# Patient Record
Sex: Female | Born: 1950 | Race: White | Hispanic: No | Smoking: Current every day smoker
Health system: Southern US, Community
[De-identification: ages and names within clinical notes are randomized; demographics above are authoritative.]

## PROBLEM LIST (undated history)

## (undated) DIAGNOSIS — F32A Depression, unspecified: Secondary | ICD-10-CM

## (undated) DIAGNOSIS — E785 Hyperlipidemia, unspecified: Secondary | ICD-10-CM

## (undated) DIAGNOSIS — I639 Cerebral infarction, unspecified: Secondary | ICD-10-CM

## (undated) DIAGNOSIS — I10 Essential (primary) hypertension: Secondary | ICD-10-CM

## (undated) DIAGNOSIS — F419 Anxiety disorder, unspecified: Secondary | ICD-10-CM

## (undated) DIAGNOSIS — F329 Major depressive disorder, single episode, unspecified: Secondary | ICD-10-CM

## (undated) DIAGNOSIS — Z720 Tobacco use: Secondary | ICD-10-CM

## (undated) DIAGNOSIS — J45909 Unspecified asthma, uncomplicated: Secondary | ICD-10-CM

## (undated) DIAGNOSIS — H913 Deaf nonspeaking, not elsewhere classified: Secondary | ICD-10-CM

## (undated) HISTORY — PX: CHOLECYSTECTOMY: SHX55

---

## 2003-05-07 ENCOUNTER — Encounter: Payer: Self-pay | Admitting: Emergency Medicine

## 2003-05-07 ENCOUNTER — Emergency Department (HOSPITAL_COMMUNITY): Admission: EM | Admit: 2003-05-07 | Discharge: 2003-05-07 | Payer: Self-pay | Admitting: Emergency Medicine

## 2009-06-14 ENCOUNTER — Inpatient Hospital Stay (HOSPITAL_COMMUNITY): Admission: EM | Admit: 2009-06-14 | Discharge: 2009-06-18 | Payer: Self-pay | Admitting: Emergency Medicine

## 2009-06-15 ENCOUNTER — Encounter (INDEPENDENT_AMBULATORY_CARE_PROVIDER_SITE_OTHER): Payer: Self-pay | Admitting: Internal Medicine

## 2009-06-16 ENCOUNTER — Encounter (INDEPENDENT_AMBULATORY_CARE_PROVIDER_SITE_OTHER): Payer: Self-pay | Admitting: Internal Medicine

## 2011-03-06 LAB — BASIC METABOLIC PANEL
BUN: 12 mg/dL (ref 6–23)
BUN: 18 mg/dL (ref 6–23)
CO2: 26 mEq/L (ref 19–32)
CO2: 26 mEq/L (ref 19–32)
Calcium: 8.9 mg/dL (ref 8.4–10.5)
Calcium: 9.4 mg/dL (ref 8.4–10.5)
Calcium: 9.9 mg/dL (ref 8.4–10.5)
Chloride: 105 mEq/L (ref 96–112)
Chloride: 106 mEq/L (ref 96–112)
Creatinine, Ser: 0.54 mg/dL (ref 0.4–1.2)
Creatinine, Ser: 0.63 mg/dL (ref 0.4–1.2)
Creatinine, Ser: 0.69 mg/dL (ref 0.4–1.2)
GFR calc Af Amer: >60 mL/min (ref >60)
GFR calc Af Amer: >60 mL/min (ref >60)
GFR calc Af Amer: >60 mL/min (ref >60)
GFR calc non Af Amer: >60 mL/min (ref >60)
GFR calc non Af Amer: >60 mL/min (ref >60)
GFR calc non Af Amer: >60 mL/min (ref >60)
Glucose, Bld: 94 mg/dL (ref 70–99)
Glucose, Bld: 98 mg/dL (ref 70–99)
Glucose, Bld: 99 mg/dL (ref 70–99)
Potassium: 3.3 mEq/L — ABNORMAL LOW (ref 3.5–5.1)
Potassium: 4.1 mEq/L (ref 3.5–5.1)
Sodium: 138 mEq/L (ref 135–145)
Sodium: 141 mEq/L (ref 135–145)

## 2011-03-06 LAB — MAGNESIUM: Magnesium: 1.7 mg/dL (ref 1.5–2.5)

## 2011-03-06 LAB — COMPREHENSIVE METABOLIC PANEL
BUN: 3 mg/dL — ABNORMAL LOW (ref 6–23)
CO2: 25 mEq/L (ref 19–32)
Calcium: 9.2 mg/dL (ref 8.4–10.5)
Chloride: 107 mEq/L (ref 96–112)
Creatinine, Ser: 0.55 mg/dL (ref 0.4–1.2)
GFR calc non Af Amer: >60 mL/min (ref >60)
Glucose, Bld: 95 mg/dL (ref 70–99)
Total Bilirubin: 0.7 mg/dL (ref 0.3–1.2)

## 2011-03-06 LAB — LUPUS ANTICOAGULANT PANEL: Lupus Anticoagulant: NOT DETECTED

## 2011-03-06 LAB — CBC
HCT: 46 % (ref 36.0–46.0)
HCT: 46.7 % — ABNORMAL HIGH (ref 36.0–46.0)
Hemoglobin: 17.3 g/dL — ABNORMAL HIGH (ref 12.0–15.0)
MCHC: 34.4 g/dL (ref 30.0–36.0)
MCV: 97.9 fL (ref 78.0–100.0)
Platelets: 181 10*3/uL (ref 150–400)
Platelets: 186 10*3/uL (ref 150–400)
Platelets: 190 10*3/uL (ref 150–400)
RDW: 13.2 % (ref 11.5–15.5)
RDW: 13.3 % (ref 11.5–15.5)
RDW: 13.4 % (ref 11.5–15.5)

## 2011-03-06 LAB — POCT CARDIAC MARKERS
CKMB, poc: 1 ng/mL (ref 1.0–8.0)
Myoglobin, poc: 50.8 ng/mL (ref 12–200)
Troponin i, poc: <0.05 ng/mL (ref 0.00–0.09)

## 2011-03-06 LAB — CREATININE, URINE, RANDOM: Creatinine, Urine: 80.5 mg/dL

## 2011-03-06 LAB — LIPID PANEL
LDL Cholesterol: 222 mg/dL — ABNORMAL HIGH (ref 0–99)
Triglycerides: 234 mg/dL — ABNORMAL HIGH (ref <150)

## 2011-03-06 LAB — BETA-2-GLYCOPROTEIN I ABS, IGG/M/A
Beta-2 Glyco I IgG: <10 U/mL (ref <20)
Beta-2-Glycoprotein I IgA: <10 U/mL (ref <10)

## 2011-03-06 LAB — DIFFERENTIAL
Basophils Absolute: 0.2 10*3/uL — ABNORMAL HIGH (ref 0.0–0.1)
Basophils Relative: 2 % — ABNORMAL HIGH (ref 0–1)
Lymphocytes Relative: 28 % (ref 12–46)
Monocytes Absolute: 0.5 10*3/uL (ref 0.1–1.0)
Neutro Abs: 5.2 10*3/uL (ref 1.7–7.7)
Neutrophils Relative %: 63 % (ref 43–77)

## 2011-03-06 LAB — PROTIME-INR
INR: 1.1 (ref 0.00–1.49)
Prothrombin Time: 14.4 seconds (ref 11.6–15.2)

## 2011-03-06 LAB — BRAIN NATRIURETIC PEPTIDE: Pro B Natriuretic peptide (BNP): 199 pg/mL — ABNORMAL HIGH (ref 0.0–100.0)

## 2011-03-06 LAB — URINALYSIS, ROUTINE W REFLEX MICROSCOPIC
Nitrite: NEGATIVE
Protein, ur: 100 mg/dL — AB
Specific Gravity, Urine: 1.005 (ref 1.005–1.030)
Urobilinogen, UA: 0.2 mg/dL (ref 0.0–1.0)

## 2011-03-06 LAB — FACTOR 5 LEIDEN

## 2011-03-06 LAB — PROTHROMBIN GENE MUTATION

## 2011-03-06 LAB — METANEPHRINES, URINE, 24 HOUR: Volume, Urine-METAN: 1250 mL

## 2011-03-06 LAB — CATECHOLAMINES, FRACTIONATED, URINE, 24 HOUR
Norepinephrine 24 Hr Urine: 50 ug/24hr (ref <80)
Total urine volume: 1250 mL

## 2011-03-06 LAB — CARDIOLIPIN ANTIBODIES, IGG, IGM, IGA
Anticardiolipin IgA: <10 [APL'U] — ABNORMAL LOW (ref <13)
Anticardiolipin IgG: <10 [GPL'U] — ABNORMAL LOW (ref <11)

## 2011-03-06 LAB — ANTITHROMBIN III: AntiThromb III Func: 96 % (ref 76–126)

## 2011-03-06 LAB — URINE MICROSCOPIC-ADD ON

## 2011-03-06 LAB — VMA + CREATININE, URINE (TIMED COLLECTION)
VMA, 24H Ur Adult: 3.1 mg/24hr (ref 1.8–6.7)
Volume, Urine-VMAUR: 1250

## 2011-03-06 LAB — PROTEIN C ACTIVITY: Protein C Activity: 155 % — ABNORMAL HIGH (ref 75–133)

## 2011-03-06 LAB — SEDIMENTATION RATE: Sed Rate: 6 mm/hr (ref 0–22)

## 2011-03-06 LAB — TSH: TSH: 2.815 u[IU]/mL (ref 0.350–4.500)

## 2011-04-12 NOTE — Consult Note (Signed)
NAMECHRISHA, Sabrina Meyer NO.:  [id number]   MEDICAL RECORD NO.:  [id number]          PATIENT TYPE:  INP   LOCATION:  0103                         FACILITY:  [facility]   PHYSICIAN:  Noel Christmas, MD    DATE OF BIRTH:  [birth year]   DATE OF CONSULTATION:  DATE OF DISCHARGE:                                 CONSULTATION   REASON FOR CONSULTATION:  Acute right facial and upper extremity  weakness and numbness.   HISTORY OF PRESENT ILLNESS:  This is a 60 year old lady who experienced  acute onset of weakness and numbness involving the right side of her  face and right upper extremity yesterday.  She noticed difficulty with  writing as well as clumsiness of her right hand.  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  Symptoms persisted today that started to  improve slightly this evening.  She has been on aspirin daily.  She has  a history of hypertension and elevated cholesterol, but has been off of  antihypertensive medications as well as cholesterol medication for about  7-8 years.  Blood pressure on presentation was 236/156.  CT scan of her  head showed no acute intracranial findings.  She had bilateral deep  white matter lacunar type infarctions.  These appeared to be chronic.  She had no signs of active hemorrhage.   PAST MEDICAL HISTORY:  Remarkable for hypertension and hyperlipidemia.   MEDICATIONS:  Aspirin daily.  The patient has no other medications.   FAMILY HISTORY:  Negative for cerebrovascular disease.   PHYSICAL EXAMINATION:  GENERAL:  The patient communicated using  interpreter.  She was oriented x3.  She complained of headache.  Mental  status was normal.  Pupils were equal and reacted normally to light.  Extraocular movements were full and conjugate.  Visual fields were  intact and normal.  She had mild right lower facial numbness as well as  mild right lower facial weakness.  She had no right upper extremity  pronator drift.  Strength of upper  extremities was normal proximally and  distally.  Muscle tone was normal as well.  Deep tendon reflexes were  normal and symmetrical throughout.  Plantar responses were flexor.  She  had normal sensation in upper and lower extremities to tactile  stimulation as well as vibratory sensation.  Coordination of upper  extremities was normal.  Carotid auscultation was normal.   CLINICAL IMPRESSION:  1. Probable acute subcortical small vessel ischemic infarction      following deep white matter on the left side with residual mild      facial weakness and numbness.  2. Uncontrolled hypertension.   RECOMMENDATIONS:  1. MRI and MRA in the morning without contrast.  2. Carotid Doppler study and echocardiogram in the a.m. as well.  3. Will discontinue aspirin and substitute Plavix 75 mg per day.  4. Speech and language pathology consults for dysphagia.      Noel Christmas, MD  Electronically Signed     CS/MEDQ  D:  06/14/2009  T:  06/15/2009  Job:  [id number]

## 2011-04-12 NOTE — Discharge Summary (Signed)
Sabrina Meyer, Sabrina Meyer NO.:  [id number]   MEDICAL RECORD NO.:  [id number]          PATIENT TYPE:  INP   LOCATION:  1306                         FACILITY:  [facility]   PHYSICIAN:  Beckey Rutter, MD  DATE OF BIRTH:  1951/05/29   DATE OF ADMISSION:  06/14/2009  DATE OF DISCHARGE:                               DISCHARGE SUMMARY   PRIMARY CARE PHYSICIAN:  Unassigned to [facility].   CHIEF COMPLAINT:  Felt funny on the left side.   HISTORY OF PRESENT ILLNESS:  A 60 year old with history of hypertension,  noncompliance with her medication, who presented with difficulty  drinking secondary to facial droops on the right side.   HOSPITAL PROCEDURES:  Chest x-ray on June 14, 2009. Impression is  central airway thickening suggesting bronchitis.  No edema or  significant air space disease.   CT head without contrast on June 14, 2009.  Impression is  1. Several small lacunar infarcts in the thalami and right      periventricular white matter which are probably chronic.  An acute      small vessel stroke cannot be excluded.  2. No evidence of acute stroke or intracranial hemorrhage.  MRI to the      brain showing acute infarction in the posterior limb of internal      capsule to the left.  MRA showing no significant intracranial      stenosis.   MRA abdomen showing no evidence of renal artery stenosis.  Two separate  right renal arteries and single left renal artery show normal patency.   Sodium 149, potassium 4.1, chloride 102, bicarb 26, glucose 98, BUN 18,  creatinine 0.69. White blood count is 8.8, hemoglobin 15.9 and platelet  count of 181.   HOSPITAL COURSE BY PROBLEMS:  1. New stroke.  The patient was seen in consultation by neurology      service.  The recommendation is to change aspirin to Plavix.  It is      more important for the patient to comply with the medication. Hence      the patient is not really regulating her blood pressure      efficiently.  2.  Blood pressure. Medication as prescribed with better control.  3. Hyperlipidemia with LDL tested is 222, HDL 32, total cholesterol      301.  The patient will be discharged on statins.  4. The patient has congenital deafness and the communication has been      through a sign interpreter, her grandson.   DISCHARGE MEDICATIONS:  1. Plavix 75 mg p.o. daily.  2. Hydrochlorothiazide 25 mg p.o. daily.  3. Lisinopril 20 mg p.o. daily.  4. Lopressor 25 mg p.o. b.i.d.  5. Zocor 20 mg p.o. at bedtime.   DISCHARGE DIAGNOSES:  1. Acute infarction in the posterior limb of internal capsule to the      left.  2. 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  3. Hypercholesterolemia.  4. Hypertension.  5. Medical noncompliance.   Two-D echo result was showing left ventricular cavity was normal,  systolic function was  estimated to 50-55% with normal wall motions.   DISCHARGE PLAN:  The patient was discharged to continue on and Plavix  and the blood pressure medication as well as the statins.  The patient  was advised to follow up with the physician.  During hospital stay the  patient was counseled in regarding to tobacco abuse.      Beckey Rutter, MD  Electronically Signed     EME/MEDQ  D:  06/18/2009  T:  06/18/2009  Job:  [id number]

## 2011-04-12 NOTE — H&P (Signed)
NAMEMarland Kitchen  Sabrina Meyer, Sabrina Meyer NO.:  [id number]   MEDICAL RECORD NO.:  [id number]          PATIENT TYPE:  EMS   LOCATION:  ED                           FACILITY:  [facility]   PHYSICIAN:  Massie Maroon, MD        DATE OF BIRTH:  12-07-1950   DATE OF ADMISSION:  06/14/2009  DATE OF DISCHARGE:                              HISTORY & PHYSICAL   PRIMARY CARE PHYSICIAN:  The patient has no primary care physician.   CHIEF COMPLAINT:  Felt funny on the right side.   HISTORY OF PRESENT ILLNESS:  A 60 year old female with hypertension, not  taking her medicine, and apparently complains of decrease grip strength  and difficulty drinking because of some facial droop on the right side  as well as some facial numbness.  She noted that her symptoms started  yesterday at noon. She also complained of some slight headache.  The  patient denies any vision change, chest pain, palpitations, shortness of  breath, nausea, vomiting, abdominal pain, hematuria.  The patient states  that her legs are fine.  The patient was seen in the ED and was found to  have a systolic blood pressure of greater than 250. The patient  had CT  scan which showed several small lacunar infarcts in the thalamus and  right periventricular white matter disease, probably chronic.  The  patient will be admitted for workup of hypertensive emergency, possible  CVA.   PAST MEDICAL HISTORY:  1. Hypertension.  2. Hyperlipidemia.   PAST SURGICAL HISTORY:  Cholecystectomy.   OB/GYN HISTORY:  Normal spontaneous vaginal delivery x2, no  miscarriages.   SOCIAL HISTORY:  The patient smokes one pack per day times 40 years.  She does not drink.   FAMILY HISTORY:  Mother died at age 16 of diabetes.  Her father died at  age 65 of unknown causes.  Siblings:  One sister is alive and had heart  surgery last year and has dementia now.  One brother is healthy.   There is no family history of stroke.   ALLERGIES:  NO KNOWN DRUG  ALLERGIES.   MEDICATIONS:  None.   REVIEW OF SYSTEMS:  Negative for 10 organ systems except for pertinent  positives stated above.   PHYSICAL EXAMINATION:  VITAL SIGNS:  Temperature afebrile, pulse 93,  blood pressure 208/119, pulse ox 95% on room air.  HEENT: Anicteric, EO MI, no nystagmus, pupils 1.5 mm, symmetric, direct,  consensual, near reflexes intact.  No vision loss, mucous membranes  moist.  Tongue midline, possibly slightly deviated to the right, right  facial droop.  NECK:  No JVD, no bruit, no thyromegaly, no adenopathy.  HEART:  Regular rate and rhythm, S1 and S2. No murmurs, gallops or rubs.  LUNGS:  Clear to auscultation bilaterally.  ABDOMEN:  Soft, nontender, nondistended.  Positive bowel sounds.  EXTREMITIES:  No cyanosis, clubbing or edema, DP pulses 2+ bilaterally,  onychomycosis, skin tag behind the right knee.  LYMPH:  Nodes no adenopathy.  NEUROLOGIC:  Right facial droop, sensation on the face intact, the  patient did not  complain of any difference right or left side of the  face to pinprick, however she did complain that pinprick was slightly  decreased on the right arm.  Reflexes were 2+, symmetric, diffuse with  downgoing toe on the right and equivocal on the left, motor strength 5/5  in all four extremities. No pronator drift.   LABORATORY DATA:  WBC 8.3, hemoglobin 17.3 (high), platelet count  190,000. Sodium 142, potassium 3.3 (low), chloride 106, bicarb 26, BUN  3, creatinine 0.54, glucose 94, BNP 199, troponin-I less than 0.05. UA  showed WBC's 3 to 6, RBC's 0 to 2.   Chest x-ray showed bronchitis. CT brain showed above HPI reading.   EKG sinus tach at 105, left atrial enlargement, LVH, ST depression in F,  AVF, AVL, T-wave inversion in III, AVF.   ASSESSMENT/PLAN:  1. Right handed numbness and weakness, possibly CVA versus      hypertensive emergency.  The patient will be given Labetalol 10 mg      IV times 1 and Labetalol 100 mg p.o. b.i.d.  and hydralazine 10 mg      IV q.6 h p.r.n. systolic blood pressure greater than 180. Will also      use hydrochlorothiazide 12.5 mg p.o. daily to control blood      pressure.  The patient will be started on aspirin, Lipitor 40 mg      p.o. q.h.s..  Will obtain an MRI brain with and without contrast,      carotid ultrasound and cardiac 2-D echo and neurology consult.  We      appreciate neurology's input. We will obtain speech therapy consult      to rule out aspiration. Will check a Sed rate, ANA, TSH, B-12, RPR,      homocystine, PT, PTT, hypercoagulable panel.  2. Hypertension.  We will use labetalol, hydralazine, and      hydrochlorothiazide to control blood pressure for now. Will check      an MRA, urine catecholamines, VMA, metanephrine's, aldosterone, and      __________levels.  3. Tobacco dependence.  Nicotine patch topically 21 mg topically q.      Day.  4. DVT prophylaxis. Lovenox 40 mg SQ daily.      Massie Maroon, MD  Electronically Signed     JYK/MEDQ  D:  06/14/2009  T:  06/14/2009  Job:  [id number]

## 2012-12-02 ENCOUNTER — Emergency Department (HOSPITAL_COMMUNITY): Payer: Medicare Other

## 2012-12-02 ENCOUNTER — Inpatient Hospital Stay (HOSPITAL_COMMUNITY)
Admission: EM | Admit: 2012-12-02 | Discharge: 2012-12-04 | DRG: 304 | Disposition: A | Discharge status: Home Health | Payer: Medicare Other | Attending: Internal Medicine | Admitting: Internal Medicine

## 2012-12-02 ENCOUNTER — Encounter (HOSPITAL_COMMUNITY): Payer: Self-pay | Admitting: *Deleted

## 2012-12-02 DIAGNOSIS — H913 Deaf nonspeaking, not elsewhere classified: Secondary | ICD-10-CM

## 2012-12-02 DIAGNOSIS — Z23 Encounter for immunization: Secondary | ICD-10-CM

## 2012-12-02 DIAGNOSIS — Z7902 Long term (current) use of antithrombotics/antiplatelets: Secondary | ICD-10-CM

## 2012-12-02 DIAGNOSIS — E785 Hyperlipidemia, unspecified: Secondary | ICD-10-CM

## 2012-12-02 DIAGNOSIS — D72829 Elevated white blood cell count, unspecified: Secondary | ICD-10-CM | POA: Diagnosis present

## 2012-12-02 DIAGNOSIS — J9600 Acute respiratory failure, unspecified whether with hypoxia or hypercapnia: Secondary | ICD-10-CM

## 2012-12-02 DIAGNOSIS — F17200 Nicotine dependence, unspecified, uncomplicated: Secondary | ICD-10-CM | POA: Diagnosis present

## 2012-12-02 DIAGNOSIS — F32A Depression, unspecified: Secondary | ICD-10-CM

## 2012-12-02 DIAGNOSIS — Z8673 Personal history of transient ischemic attack (TIA), and cerebral infarction without residual deficits: Secondary | ICD-10-CM

## 2012-12-02 DIAGNOSIS — F419 Anxiety disorder, unspecified: Secondary | ICD-10-CM

## 2012-12-02 DIAGNOSIS — I5043 Acute on chronic combined systolic (congestive) and diastolic (congestive) heart failure: Secondary | ICD-10-CM | POA: Diagnosis present

## 2012-12-02 DIAGNOSIS — Z91199 Patient's noncompliance with other medical treatment and regimen due to unspecified reason: Secondary | ICD-10-CM

## 2012-12-02 DIAGNOSIS — N179 Acute kidney failure, unspecified: Secondary | ICD-10-CM | POA: Diagnosis present

## 2012-12-02 DIAGNOSIS — F411 Generalized anxiety disorder: Secondary | ICD-10-CM | POA: Diagnosis present

## 2012-12-02 DIAGNOSIS — F329 Major depressive disorder, single episode, unspecified: Secondary | ICD-10-CM

## 2012-12-02 DIAGNOSIS — I169 Hypertensive crisis, unspecified: Secondary | ICD-10-CM

## 2012-12-02 DIAGNOSIS — I10 Essential (primary) hypertension: Secondary | ICD-10-CM

## 2012-12-02 DIAGNOSIS — D45 Polycythemia vera: Secondary | ICD-10-CM | POA: Diagnosis present

## 2012-12-02 DIAGNOSIS — J45909 Unspecified asthma, uncomplicated: Secondary | ICD-10-CM | POA: Diagnosis present

## 2012-12-02 DIAGNOSIS — I509 Heart failure, unspecified: Secondary | ICD-10-CM

## 2012-12-02 DIAGNOSIS — E876 Hypokalemia: Secondary | ICD-10-CM

## 2012-12-02 DIAGNOSIS — J811 Chronic pulmonary edema: Secondary | ICD-10-CM

## 2012-12-02 DIAGNOSIS — Z9119 Patient's noncompliance with other medical treatment and regimen: Secondary | ICD-10-CM

## 2012-12-02 DIAGNOSIS — I161 Hypertensive emergency: Secondary | ICD-10-CM

## 2012-12-02 DIAGNOSIS — Z720 Tobacco use: Secondary | ICD-10-CM

## 2012-12-02 DIAGNOSIS — I639 Cerebral infarction, unspecified: Secondary | ICD-10-CM

## 2012-12-02 DIAGNOSIS — Z79899 Other long term (current) drug therapy: Secondary | ICD-10-CM

## 2012-12-02 HISTORY — DX: Major depressive disorder, single episode, unspecified: F32.9

## 2012-12-02 HISTORY — DX: Essential (primary) hypertension: I10

## 2012-12-02 HISTORY — DX: Deaf nonspeaking, not elsewhere classified: H91.3

## 2012-12-02 HISTORY — DX: Cerebral infarction, unspecified: I63.9

## 2012-12-02 HISTORY — DX: Hyperlipidemia, unspecified: E78.5

## 2012-12-02 HISTORY — DX: Tobacco use: Z72.0

## 2012-12-02 HISTORY — DX: Unspecified asthma, uncomplicated: J45.909

## 2012-12-02 HISTORY — DX: Anxiety disorder, unspecified: F41.9

## 2012-12-02 HISTORY — DX: Depression, unspecified: F32.A

## 2012-12-02 LAB — POCT I-STAT 3, ART BLOOD GAS (G3+)
O2 Saturation: 96 %
TCO2: 19 mmol/L (ref 0–100)
pCO2 arterial: 38.6 mmHg (ref 35.0–45.0)
pH, Arterial: 7.263 — ABNORMAL LOW (ref 7.350–7.450)
pO2, Arterial: 98 mmHg (ref 80.0–100.0)

## 2012-12-02 LAB — COMPREHENSIVE METABOLIC PANEL
ALT: 12 U/L (ref 0–35)
Alkaline Phosphatase: 78 U/L (ref 39–117)
BUN: 11 mg/dL (ref 6–23)
CO2: 23 mEq/L (ref 19–32)
Chloride: 99 mEq/L (ref 96–112)
GFR calc Af Amer: >90 mL/min (ref >90)
GFR calc non Af Amer: >90 mL/min (ref >90)
Glucose, Bld: 181 mg/dL — ABNORMAL HIGH (ref 70–99)
Potassium: 2.9 mEq/L — ABNORMAL LOW (ref 3.5–5.1)
Sodium: 137 mEq/L (ref 135–145)
Total Bilirubin: 0.4 mg/dL (ref 0.3–1.2)
Total Protein: 7.5 g/dL (ref 6.0–8.3)

## 2012-12-02 LAB — CBC WITH DIFFERENTIAL/PLATELET
Basophils Absolute: 0 10*3/uL (ref 0.0–0.1)
Eosinophils Absolute: 0.4 10*3/uL (ref 0.0–0.7)
HCT: 58.8 % — ABNORMAL HIGH (ref 36.0–46.0)
HCT: 51.6 % — ABNORMAL HIGH (ref 36.0–46.0)
Hemoglobin: 17.6 g/dL — ABNORMAL HIGH (ref 12.0–15.0)
Lymphocytes Relative: 38 % (ref 12–46)
Lymphocytes Relative: 11 % — ABNORMAL LOW (ref 12–46)
Lymphs Abs: 1.8 10*3/uL (ref 0.7–4.0)
MCHC: 35 g/dL (ref 30.0–36.0)
MCHC: 34.1 g/dL (ref 30.0–36.0)
Monocytes Absolute: 0.4 10*3/uL (ref 0.1–1.0)
Monocytes Relative: 2 % — ABNORMAL LOW (ref 3–12)
Neutro Abs: 9.7 10*3/uL — ABNORMAL HIGH (ref 1.7–7.7)
Neutro Abs: 13.6 10*3/uL — ABNORMAL HIGH (ref 1.7–7.7)
Neutrophils Relative %: 85 % — ABNORMAL HIGH (ref 43–77)
Platelets: 288 10*3/uL (ref 150–400)
RBC: 5.36 MIL/uL — ABNORMAL HIGH (ref 3.87–5.11)
RDW: 14.6 % (ref 11.5–15.5)

## 2012-12-02 LAB — POCT I-STAT TROPONIN I: Troponin i, poc: 0.04 ng/mL (ref 0.00–0.08)

## 2012-12-02 MED ORDER — LORAZEPAM 2 MG/ML IJ SOLN
0.5000 mg | Freq: Four times a day (QID) | INTRAMUSCULAR | Status: DC | PRN
  Administered 2012-12-02: 0.5 mg via INTRAVENOUS
  Filled 2012-12-02: qty 1

## 2012-12-02 MED ORDER — SODIUM CHLORIDE 0.9 % IV SOLN
250.0000 mL | INTRAVENOUS | Status: DC | PRN
  Administered 2012-12-02: 250 mL via INTRAVENOUS

## 2012-12-02 MED ORDER — MAGNESIUM SULFATE 40 MG/ML IJ SOLN: 2.0000 g | Freq: Once | INTRAMUSCULAR | Status: DC

## 2012-12-02 MED ORDER — ALBUTEROL SULFATE (5 MG/ML) 0.5% IN NEBU
2.5000 mg | INHALATION_SOLUTION | Freq: Four times a day (QID) | RESPIRATORY_TRACT | Status: DC
  Administered 2012-12-03: 2.5 mg via RESPIRATORY_TRACT
  Filled 2012-12-02: qty 0.5

## 2012-12-02 MED ORDER — PNEUMOCOCCAL VAC POLYVALENT 25 MCG/0.5ML IJ INJ
0.5000 mL | INJECTION | INTRAMUSCULAR | Status: AC
  Administered 2012-12-03: 0.5 mL via INTRAMUSCULAR
  Filled 2012-12-02: qty 0.5

## 2012-12-02 MED ORDER — ALBUTEROL (5 MG/ML) CONTINUOUS INHALATION SOLN: 10.0000 mg/h | INHALATION_SOLUTION | Freq: Once | RESPIRATORY_TRACT | Status: DC

## 2012-12-02 MED ORDER — ACETAMINOPHEN 650 MG RE SUPP
325.0000 mg | Freq: Four times a day (QID) | RECTAL | Status: DC | PRN
  Administered 2012-12-02: 325 mg via RECTAL
  Filled 2012-12-02: qty 1

## 2012-12-02 MED ORDER — SODIUM CHLORIDE 0.9 % IJ SOLN
3.0000 mL | Freq: Two times a day (BID) | INTRAMUSCULAR | Status: DC
  Administered 2012-12-02 – 2012-12-04 (×3): 3 mL via INTRAVENOUS

## 2012-12-02 MED ORDER — POTASSIUM CHLORIDE 10 MEQ/100ML IV SOLN
10.0000 meq | Freq: Once | INTRAVENOUS | Status: AC
  Administered 2012-12-02: 10 meq via INTRAVENOUS
  Filled 2012-12-02: qty 100
  Filled 2012-12-02: qty 200

## 2012-12-02 MED ORDER — FUROSEMIDE 10 MG/ML IJ SOLN
40.0000 mg | Freq: Three times a day (TID) | INTRAMUSCULAR | Status: DC
  Administered 2012-12-02 – 2012-12-03 (×2): 40 mg via INTRAVENOUS
  Filled 2012-12-02 (×5): qty 4

## 2012-12-02 MED ORDER — IPRATROPIUM BROMIDE 0.02 % IN SOLN
0.5000 mg | RESPIRATORY_TRACT | Status: DC
  Administered 2012-12-03: 0.5 mg via RESPIRATORY_TRACT
  Filled 2012-12-02: qty 2.5

## 2012-12-02 MED ORDER — POTASSIUM CHLORIDE 10 MEQ/100ML IV SOLN
10.0000 meq | INTRAVENOUS | Status: AC
  Administered 2012-12-02 – 2012-12-03 (×3): 10 meq via INTRAVENOUS
  Filled 2012-12-02 (×2): qty 100

## 2012-12-02 MED ORDER — ENOXAPARIN SODIUM 40 MG/0.4ML ~~LOC~~ SOLN
40.0000 mg | Freq: Every day | SUBCUTANEOUS | Status: DC
  Administered 2012-12-02 – 2012-12-03 (×2): 40 mg via SUBCUTANEOUS
  Filled 2012-12-02 (×3): qty 0.4

## 2012-12-02 MED ORDER — CLOPIDOGREL BISULFATE 75 MG PO TABS
75.0000 mg | ORAL_TABLET | Freq: Every day | ORAL | Status: DC
  Administered 2012-12-03 – 2012-12-04 (×2): 75 mg via ORAL
  Filled 2012-12-02 (×2): qty 1

## 2012-12-02 MED ORDER — NITROGLYCERIN IN D5W 200-5 MCG/ML-% IV SOLN
10.0000 ug/min | INTRAVENOUS | Status: DC
  Administered 2012-12-02: 20 ug/min via INTRAVENOUS
  Filled 2012-12-02: qty 250

## 2012-12-02 MED ORDER — NITROGLYCERIN 0.4 MG SL SUBL
0.4000 mg | SUBLINGUAL_TABLET | SUBLINGUAL | Status: DC | PRN
  Administered 2012-12-02: 0.4 mg via SUBLINGUAL

## 2012-12-02 MED ORDER — SODIUM CHLORIDE 0.9 % IJ SOLN: 3.0000 mL | INTRAMUSCULAR | Status: DC | PRN

## 2012-12-02 MED ORDER — POTASSIUM CHLORIDE 10 MEQ/100ML IV SOLN
10.0000 meq | Freq: Once | INTRAVENOUS | Status: AC
  Administered 2012-12-02: 10 meq via INTRAVENOUS

## 2012-12-02 MED ORDER — NITROGLYCERIN 0.4 MG SL SUBL
SUBLINGUAL_TABLET | SUBLINGUAL | Status: AC
  Administered 2012-12-02: 0.4 mg via SUBLINGUAL
  Filled 2012-12-02: qty 25

## 2012-12-02 NOTE — ED Notes (Signed)
Patient is resting,  Decreased sob,  heartrate continues to improve,  Rates now in the 120's.  Continues to have occasion pvc.  Patient denies pain.  Lucila Maine continues to assist to provide information.  Deaf interpretor enroute, should be here by 2000

## 2012-12-02 NOTE — ED Notes (Signed)
Patient was ok prior to sudden onset of shortness of breath.  Patient told family she could not breathe.  Patient then developed diaphoresis.   Quick progression to weakness and severe shortness of breath within 15 min per the family.  Patient had no prior complaints today.  Patient was found by ems to have sinus tach 157, spo2 80 percent.  Initial lung sounds wheezing.  Patient restless and pale.  ems unable to obtain bp due to restlessness.  Patient arrives to ED with neb treatment in progress.  Sitting fully upright.  Patient received total of 10 mg albuterol, 125mg  solumedrol, and 0.5mg  atrovent.  Patient sat improved to 87 percent with ems treatment.  Patient is pale in color.  Diaphoretic.  Restless.  Tachypneic.  Patient with ongoing tachycardia and hypertension noted.  Manual bp 240/140

## 2012-12-02 NOTE — ED Notes (Signed)
Patient with marked improvement with bipap,  Her skin has dried up.  She continues to have tachypnea.  Patient with ongoing tachycardia as well.  Rate in the upper 140's.  Patient with occassional pvc noted as well.  Patient denies pain.  Advised by means of family that she may develop a headache.  Continue to encourage patient to relax, let the bipap assist her to breathe

## 2012-12-02 NOTE — H&P (Signed)
PULMONARY  / CRITICAL CARE MEDICINE  Name: Sabrina Meyer MRN: [id number] DOB: 1951/02/26    LOS: 0  REFERRING MD :  EDP, Dr. Bernette Mayers   CHIEF COMPLAINT:  Shortness of breath  BRIEF PATIENT DESCRIPTION: Sabrina Meyer is a 62 y.o., female with a PMHX of medication noncompliance, hypertension, multiple prior strokes without residual deficit who is admitted to [facility] on 12/02/2012 with hypertensive emergency presenting as flash pulmonary edema in the setting of medication noncompliance and ongoing tobacco abuse.  LINES / TUBES: PIV Right hand 01/05 >>> PIV Right forearm 01/05 >>>  CULTURES: None  ANTIBIOTICS: None  SIGNIFICANT EVENTS:  01/05 - Admitted to [facility] with hypertensive emergency with pulmonary edema  LEVEL OF CARE:  MICU PRIMARY SERVICE:  PCCM CONSULTANTS:  None CODE STATUS: Full DIET:  NPO DVT Px:  Lovenox GI Px:  None  HISTORY OF PRESENT ILLNESS:  Sabrina Meyer is a 62 y.o., female with a PMHX of medication noncompliance, hypertension, multiple prior strokes without residual deficit who is admitted to [facility] on 12/02/2012 acute onset shortness of breath that started approximately 2 hours prior to presentation while the patient was eating dinner - she was otherwise of her normal state of health just prior to this episode. She had associated mild cough during this episode. This prompted her presentation to the ER today. Throughout episode, the patient has remained without symptoms of chest pain, palpitations, vision changes, headache, nausea, vomiting. She has not been sick recently, denies illicit drug use or alcohol abuse. Of note, she does not follow with her regular PCP and is not taking any of the previously prescribed antihypertensives secondary to financial constraints.    PAST MEDICAL HISTORY :  Past Medical History  Diagnosis Date  . Asthma   . Hypertension   . Stroke     Multiple prior strokes, last in 05/2009 (posterior limb of internal capsule), evidence of  prior  bilateral thalamic lacunar infarcts  . Anxiety   . Depression   . Deaf, nonspeaking   . Tobacco abuse   . Hyperlipidemia     Past Surgical History  Procedure Date  . Cholecystectomy     Prior to Admission medications   Medication Sig Start Date End Date Taking? Authorizing Provider  ibuprofen (ADVIL,MOTRIN) 200 MG tablet Take 400 mg by mouth every 6 (six) hours as needed. For headache   Yes Historical Provider, MD   Allergies: No Known Allergies   FAMILY HISTORY:  Family History  Problem Relation Age of Onset  . Diabetes Mother   . Diabetes Sister   . Heart disease Mother   . Dementia Sister    SOCIAL HISTORY:  reports that she has been smoking Cigarettes.  She has a 44 pack-year smoking history. She does not have any smokeless tobacco history on file. She reports that she does not drink alcohol or use illicit drugs.   REVIEW OF SYSTEMS:  11-point review of systems was reviewed with the patient and is negative except as noted in the HPI and as listed below:  Constitutional:  denies fever, chills, diaphoresis, appetite change and fatigue.  HEENT: denies photophobia, eye pain, redness, hearing loss, ear pain, congestion, sore throat, rhinorrhea, sneezing, neck pain, neck stiffness and tinnitus.  Respiratory: admits to SOB, DOE, cough. Denies chest tightness, and wheezing.  Cardiovascular: denies chest pain, palpitations and leg swelling.  Gastrointestinal: denies nausea, vomiting, abdominal pain, diarrhea, constipation, blood in stool.  Genitourinary: denies dysuria, urgency, frequency, hematuria, flank pain and difficulty urinating.  Musculoskeletal: denies  myalgias, back pain, joint swelling, arthralgias and gait problem.   Skin: admits to rash Denies pallor and wound.  Neurological: denies dizziness, seizures, syncope, weakness, light-headedness, numbness and headaches.   Hematological: denies adenopathy, easy bruising, personal or family bleeding history.    Psychiatric/ Behavioral: admits to anxiety.      INTERVAL HISTORY:  01/05 - Patient started on NTG gtt and BiPAP in ER with improvement of SOB symptoms.  VITAL SIGNS: Temp:  [97.7 F (36.5 C)] 97.7 F (36.5 C) (01/05 1933) Pulse Rate:  [59-177] 111  (01/05 2115) Resp:  [21-42] 21  (01/05 2115) BP: (148-262)/(97-188) 149/98 mmHg (01/05 2115) SpO2:  [82 %-98 %] 95 % (01/05 2115) FiO2 (%):  [80 %] 80 % (01/05 1821) HEMODYNAMICS:   VENTILATOR SETTINGS: Vent Mode:  [-]  FiO2 (%):  [80 %] 80 % INTAKE / OUTPUT: Intake/Output    None      PHYSICAL EXAMINATION: General: Vital signs reviewed and noted. Well-developed, well-nourished, in no acute distress; alert, appropriate and cooperative throughout examination.  Head: Normocephalic, atraumatic.  Eyes: PERRL, EOMI, No signs of anemia or jaundince.  Nose: Mucous membranes moist, not inflammed, nonerythematous.  Throat: Oropharynx nonerythematous, no exudate appreciated.   Neck: No deformities, masses, or tenderness noted. Supple, No carotid Bruits, no JVD.  Lungs:  Normal respiratory effort. Basilar crackles bilaterally.  Heart: Regular rhythm, tachycardic. S1 and S2 normal without gallop, murmur, or rubs.  Abdomen:  BS normoactive. Soft, Nondistended, non-tender.  No masses or organomegaly.  Extremities: No pretibial edema.  Neurologic: A&O X3, CN II - XII are grossly intact. Motor strength is 5/5 in the all 4 extremities, Sensations intact to light touch.    LABS: CBC  Lab 12/02/12 1850 12/02/12 1751  WBC 15.9* --  HGB 17.6* 20.6*  HCT 51.6* 58.8*  PLT 223 288    Chemistry  Lab 12/02/12 1850  NA 137  K 2.9*  CL 99  CO2 23  BUN 11  CREATININE 0.72  CALCIUM 9.0  MG --  PHOS --  GLUCOSE 181*    Liver function  Lab 12/02/12 1850  AST 23  ALT 12  ALKPHOS 78  BILITOT 0.4  PROT 7.5  ALBUMIN 3.6    Coags No results found for this basename: APTT:3,INR:3 in the last 168 hours Sepsis markers No results  found for this basename: LATICACIDVEN:3,PROCALCITON:3 in the last 168 hours Cardiac markers No results found for this basename: CKTOTAL:3,CKMB:3,TROPONINI:3 in the last 168 hours BNP  Lab 12/02/12 1851 12/02/12 1751  PROBNP 4273.0* 3502.0*   ABG  Lab 12/02/12 1807  PHART 7.263*  PCO2ART 38.6  PO2ART 98.0  HCO3 17.5*  TCO2 19    CBG trend No results found for this basename: GLUCAP:5 in the last 168 hours   IMAGING:  pCXR (12/02/2012) - 1. Diffuse interstitial opacity with basilar airspace opacities. Heart size in the upper normal range. The appearance of Kerley B lines favors acute pulmonary edema, with atypical pneumonia is a differential diagnostic consideration.   DIAGNOSES: Principal Problem:  *Hypertensive emergency Active Problems:  Hypertension  Stroke  Anxiety  Deaf, nonspeaking  Tobacco abuse  Pulmonary edema  Hypokalemia  Hyperlipidemia  Acute respiratory insufficiency   ASSESSMENT / PLAN:  PULMONARY Assessment:  1) Acute respiratory insufficiency - requiring NIPPV. Secondary to pulmonary edema in setting of hypertensive emergency. 2) Tobacco abuse - 1ppd x 44 years. Patient provided smoking cessation counseling.  3) Chronic cough Plan:   - Continue BiPAP -  As BP stabilizes and increased urine output (lasix still pending), will trial off of BiPAP with recheck ABG 30 minutes after removal. - Smoking cessation counseling. - May benefit from outpatient PFT. - Recheck CXR in AM. - Albuterol/ Atrovent nebs.  CARDIOVASCULAR Assessment:  1) Hypertensive emergency -   With indication of vascular congestion per CXR.  2) History of hypertension - not on home medications. Previously recommended HCTZ, Lisinopril, Metoprolol per last dc summary 05/2009 3) Hyperlipidemia - not currently on therapy. Plan:   - Continue NTG drip - Start Lasix 40mg  IV TID - Check FLP, goal LDL < 100 in setting of stroke history. - Check 2-D  echocardiogram.  RENAL Assessment:  1) Hypokalemia - K 2.9 on admission. KCl x 1 total of in ED. Plan:   - KCl x 4 additional runs  GASTROINTESTINAL Assessment: No acute issues Plan:   - None  HEMATOLOGIC Assessment:  1) Polycythemia - Hgb on admission 17.6, which seems to be her baseline per record review. Likely contributed by presumed COPD in setting of significant tobacco abuse.  Plan:   - Treat acute pulmonary issues and counsel regarding smoking cessation. - Consider to check Epo levels.  INFECTIOUS Assessment: No acute issues Plan:   - None  ENDOCRINE Assessment: No acute issues Plan:   - None  NEUROLOGIC Assessment:  1) History of multiple prior strokes - No new neurologic deficits noted per exam. Noncompliant with Plavix, that was started during last stroke 05/2009. Plan:   - Resume Plavix. - Check FLP, goal LDL < 100 mg/dL.  - Check A1c. - Encouraged smoking cessation.  SOCIAL Assessment:  1) Financial constraints - currently does not have a PCP and has difficulty with affording medications. She has just enrolled with a new insurance. 2) Anxiety - stable at this time, not currently on medications, previously well controlled on Alprazolam. Plan:  - SW consult to help establish with PCP and to assess insurance issues. - Continue to monitor anxiety.    CLINICAL SUMMARY: Ms. Sabrina Meyer is a 62 y.o., female with a PMHX of medication noncompliance, hypertension, multiple prior strokes without residual deficit who is admitted to [facility] on 12/02/2012 with hypertensive emergency presenting as flash pulmonary edema in the setting of medication noncompliance and ongoing tobacco abuse.   Signed: Johnette Abraham, Roma Schanz, Internal Medicine Resident Pager: [phone] (7PM-7AM) 12/02/2012, 9:27 PM    Case and plan of care was discussed with PCCM fellow, Dr. Overton Mam.   I have personally obtained a history, examined the patient, evaluated  laboratory and imaging results, formulated the assessment and plan and placed orders.  CRITICAL CARE: The patient is critically ill with multiple organ systems failure and requires high complexity decision making for assessment and support, frequent evaluation and titration of therapies, application of advanced monitoring technologies and extensive interpretation of multiple databases. Critical Care Time devoted to patient care services described in this note is 45 minutes.    Overton Mam, M.D. Pulmonary and Critical Care Medicine [facility] Pager: [phone]  12/03/2012, 1:33 AM

## 2012-12-02 NOTE — ED Provider Notes (Signed)
I saw and evaluated the patient, reviewed the resident's note and I agree with the findings and plan.  Agree with EKG interpretation if present.   Pt with sudden onset of SOB, given nebs enroute, but appears to be flash pulmonary edema, hypertensive crisis. Improving with NTG and BiPAP.   Charles B. Bernette Mayers, MD 12/02/12 1610

## 2012-12-02 NOTE — ED Provider Notes (Signed)
History     CSN: [id number]  Arrival date & time 12/02/12  1743   First MD Initiated Contact with Patient 12/02/12 1748      No chief complaint on file.   (Consider location/radiation/quality/duration/timing/severity/associated sxs/prior treatment) Patient is a 62 y.o. female presenting with general illness. The history is provided by the patient. No language interpreter was used.  Illness  The current episode started today. The problem occurs continuously. The problem has been rapidly worsening. The problem is severe. Nothing relieves the symptoms. Nothing aggravates the symptoms. Pertinent negatives include no fever, no abdominal pain, no constipation, no diarrhea, no nausea, no vomiting, no congestion, no headaches, no sore throat, no cough and no rash.    No past medical history on file.  No past surgical history on file.  No family history on file.  History  Substance Use Topics  . Smoking status: Not on file  . Smokeless tobacco: Not on file  . Alcohol Use: Not on file    OB History    No data available      Review of Systems  Constitutional: Negative for fever and chills.  HENT: Negative for congestion and sore throat.   Respiratory: Positive for shortness of breath. Negative for cough.   Cardiovascular: Negative for chest pain and leg swelling.  Gastrointestinal: Negative for nausea, vomiting, abdominal pain, diarrhea and constipation.  Genitourinary: Negative for dysuria and frequency.  Skin: Negative for color change and rash.  Neurological: Negative for dizziness and headaches.  Psychiatric/Behavioral: Negative for confusion and agitation.  All other systems reviewed and are negative.    Allergies  Review of patient's allergies indicates no known allergies.  Home Medications   Current Outpatient Rx  Name  Route  Sig  Dispense  Refill  . IBUPROFEN 200 MG PO TABS   Oral   Take 400 mg by mouth every 6 (six) hours as needed. For headache            There were no vitals taken for this visit.  Physical Exam  Vitals reviewed. Constitutional: She is oriented to person, place, and time. She appears well-developed and well-nourished. She appears distressed.  HENT:  Head: Normocephalic and atraumatic.  Eyes: EOM are normal. Pupils are equal, round, and reactive to light.  Neck: Normal range of motion. Neck supple. JVD present.  Cardiovascular: Regular rhythm.  Tachycardia present.   Pulmonary/Chest: Accessory muscle usage present. Tachypnea noted. She is in respiratory distress. She has decreased breath sounds. She has rhonchi. She has rales.  Abdominal: Soft. She exhibits no distension.  Musculoskeletal: Normal range of motion. She exhibits no edema.       Right lower leg: She exhibits no edema.       Left lower leg: She exhibits no edema.  Neurological: She is alert and oriented to person, place, and time.  Skin: Skin is warm. She is diaphoretic.  Psychiatric: She has a normal mood and affect. She is agitated.    ED Course  Procedures (including critical care time)   Labs Reviewed  CBC WITH DIFFERENTIAL  COMPREHENSIVE METABOLIC PANEL  PRO B NATRIURETIC PEPTIDE   Results for orders placed during the hospital encounter of 12/02/12  CBC WITH DIFFERENTIAL      Component Value Range   WBC 17.8 (*) 4.0 - 10.5 K/uL   RBC 6.08 (*) 3.87 - 5.11 MIL/uL   Hemoglobin 20.6 (*) 12.0 - 15.0 g/dL   HCT 40.9 (*) 81.1 - 91.4 %   MCV 96.7  78.0 - 100.0 fL   MCH 33.9  26.0 - 34.0 pg   MCHC 35.0  30.0 - 36.0 g/dL   RDW 16.1  09.6 - 04.5 %   Platelets 288  150 - 400 K/uL   Neutrophils Relative 55  43 - 77 %   Lymphocytes Relative 38  12 - 46 %   Monocytes Relative 5  3 - 12 %   Eosinophils Relative 2  0 - 5 %   Basophils Relative 0  0 - 1 %   Neutro Abs 9.7 (*) 1.7 - 7.7 K/uL   Lymphs Abs 6.8 (*) 0.7 - 4.0 K/uL   Monocytes Absolute 0.9  0.1 - 1.0 K/uL   Eosinophils Absolute 0.4  0.0 - 0.7 K/uL   Basophils Absolute 0.0  0.0 - 0.1  K/uL   WBC Morphology ATYPICAL LYMPHOCYTES    PRO B NATRIURETIC PEPTIDE      Component Value Range   Pro B Natriuretic peptide (BNP) 3502.0 (*) 0 - 125 pg/mL  POCT I-STAT TROPONIN I      Component Value Range   Troponin i, poc 0.04  0.00 - 0.08 ng/mL   Comment 3           CBC WITH DIFFERENTIAL      Component Value Range   WBC 15.9 (*) 4.0 - 10.5 K/uL   RBC 5.36 (*) 3.87 - 5.11 MIL/uL   Hemoglobin 17.6 (*) 12.0 - 15.0 g/dL   HCT 40.9 (*) 81.1 - 91.4 %   MCV 96.3  78.0 - 100.0 fL   MCH 32.8  26.0 - 34.0 pg   MCHC 34.1  30.0 - 36.0 g/dL   RDW 78.2  95.6 - 21.3 %   Platelets 223  150 - 400 K/uL   Neutrophils Relative 85 (*) 43 - 77 %   Neutro Abs 13.6 (*) 1.7 - 7.7 K/uL   Lymphocytes Relative 11 (*) 12 - 46 %   Lymphs Abs 1.8  0.7 - 4.0 K/uL   Monocytes Relative 2 (*) 3 - 12 %   Monocytes Absolute 0.4  0.1 - 1.0 K/uL   Eosinophils Relative 0  0 - 5 %   Eosinophils Absolute 0.1  0.0 - 0.7 K/uL   Basophils Relative 0  0 - 1 %   Basophils Absolute 0.1  0.0 - 0.1 K/uL  PRO B NATRIURETIC PEPTIDE      Component Value Range   Pro B Natriuretic peptide (BNP) 4273.0 (*) 0 - 125 pg/mL  COMPREHENSIVE METABOLIC PANEL      Component Value Range   Sodium 137  135 - 145 mEq/L   Potassium 2.9 (*) 3.5 - 5.1 mEq/L   Chloride 99  96 - 112 mEq/L   CO2 23  19 - 32 mEq/L   Glucose, Bld 181 (*) 70 - 99 mg/dL   BUN 11  6 - 23 mg/dL   Creatinine, Ser 0.86  0.50 - 1.10 mg/dL   Calcium 9.0  8.4 - 57.8 mg/dL   Total Protein 7.5  6.0 - 8.3 g/dL   Albumin 3.6  3.5 - 5.2 g/dL   AST 23  0 - 37 U/L   ALT 12  0 - 35 U/L   Alkaline Phosphatase 78  39 - 117 U/L   Total Bilirubin 0.4  0.3 - 1.2 mg/dL   GFR calc non Af Amer >90  >90 mL/min   GFR calc Af Amer >90  >90 mL/min  POCT I-STAT 3, BLOOD  GAS (G3+)      Component Value Range   pH, Arterial 7.263 (*) 7.350 - 7.450   pCO2 arterial 38.6  35.0 - 45.0 mmHg   pO2, Arterial 98.0  80.0 - 100.0 mmHg   Bicarbonate 17.5 (*) 20.0 - 24.0 mEq/L   TCO2 19  0  - 100 mmol/L   O2 Saturation 96.0     Acid-base deficit 9.0 (*) 0.0 - 2.0 mmol/L   Patient temperature 98.6 F     Collection site RADIAL, ALLEN'S TEST ACCEPTABLE     Drawn by RT     Sample type ARTERIAL     DG Chest Portable 1 View (Final result)   Result time:12/02/12 1815    Final result by Rad Results In Interface (12/02/12 18:15:23)    Narrative:   *RADIOLOGY REPORT*  Clinical Data: Shortness of breath. Hypertension.  PORTABLE CHEST - 1 VIEW  Comparison: 06/04/2009  Findings: Diffuse interstitial opacities are present with confluent airspace opacities at the lung bases, right greater than left. There is suspicion for Kerley B lines peripherally, but the heart size is only in the upper limits of the normal range rather than overtly enlarged.  Left proximal humeral enchondroma noted.  IMPRESSION:  1. Diffuse interstitial opacity with basilar airspace opacities. Heart size in the upper normal range. The appearance of Kerley B lines favors acute pulmonary edema, with atypical pneumonia is a differential diagnostic consideration.   Original Report Authenticated By: Gaylyn Rong, M.D.         Date: 12/03/2012  Rate: 170  Rhythm: sinus tachycardia  QRS Axis: normal  Intervals: QT prolonged  ST/T Wave abnormalities: normal  Conduction Disutrbances:none  Narrative Interpretation:   Old EKG Reviewed: changes noted   No results found.   No diagnosis found.    MDM  Pt w/ PMHx of asthma, remote CVA and HTN, not on any meds now w/ acute onset dyspnea. Was eating dinner and developed resp distress, EMS was called and pt noted to be wheezing and tight, given albuterol and duoneb x 2 and 125mg  solumedrol. Placed on NRB. On arrival to ED pt in severe resp distress, sats in low 80s on NRB, BP 240/140, pulse 170, RR 40. Immediately placed on BIPAP, significant rales and rhonchi on exam. No wheeze. + JVD, no LE edema. Concern for HTN emergency, flash pulmonary edema.  Given SL nitro and placed on nitro gtt, ECG - sinus tach w/out acute ischemia, BNP elevated at 4273 ( no hx of CHF), potassium 2.9 - given IV potassium, ABG 7.26/38/98/17, WBC 15K, troponin neg. CXR w/ diffuse pulm edema.   Reassessed: RR improved, sats 100% on bipap, BP now 140s/90s. resp distress resolved. Pt denies HA or focal weakness. No indication for CT head at this time. D/w critical care and pt admitted to their service in serious condition.   1. Hypertensive crisis   2. CHF (congestive heart failure)   3. Hypertension   4. Stroke   5. Anxiety   6. Depression   7. Deaf, nonspeaking   8. Tobacco abuse   9. Hyperlipidemia          Audelia Hives, MD 12/03/12 [phone]

## 2012-12-03 ENCOUNTER — Inpatient Hospital Stay (HOSPITAL_COMMUNITY): Payer: Medicare Other

## 2012-12-03 DIAGNOSIS — I10 Essential (primary) hypertension: Secondary | ICD-10-CM

## 2012-12-03 DIAGNOSIS — I509 Heart failure, unspecified: Secondary | ICD-10-CM

## 2012-12-03 DIAGNOSIS — J9600 Acute respiratory failure, unspecified whether with hypoxia or hypercapnia: Secondary | ICD-10-CM

## 2012-12-03 DIAGNOSIS — F17200 Nicotine dependence, unspecified, uncomplicated: Secondary | ICD-10-CM

## 2012-12-03 LAB — CBC WITH DIFFERENTIAL/PLATELET
Basophils Absolute: 0 10*3/uL (ref 0.0–0.1)
Eosinophils Absolute: 0 10*3/uL (ref 0.0–0.7)
Eosinophils Relative: 0 % (ref 0–5)
Lymphs Abs: 1.3 10*3/uL (ref 0.7–4.0)
MCH: 32.6 pg (ref 26.0–34.0)
MCHC: 34.1 g/dL (ref 30.0–36.0)
MCV: 95.6 fL (ref 78.0–100.0)
Platelets: 208 10*3/uL (ref 150–400)
RDW: 14.4 % (ref 11.5–15.5)

## 2012-12-03 LAB — LIPID PANEL: Total CHOL/HDL Ratio: 6.3 RATIO

## 2012-12-03 LAB — COMPREHENSIVE METABOLIC PANEL
ALT: 15 U/L (ref 0–35)
AST: 26 U/L (ref 0–37)
Albumin: 3.5 g/dL (ref 3.5–5.2)
Alkaline Phosphatase: 62 U/L (ref 39–117)
Chloride: 101 mEq/L (ref 96–112)
Potassium: 4.2 mEq/L (ref 3.5–5.1)
Total Bilirubin: 0.5 mg/dL (ref 0.3–1.2)

## 2012-12-03 LAB — TSH: TSH: 0.917 u[IU]/mL (ref 0.350–4.500)

## 2012-12-03 LAB — HEMOGLOBIN A1C
Hgb A1c MFr Bld: 5.2 % (ref <5.7)
Mean Plasma Glucose: 103 mg/dL (ref <117)

## 2012-12-03 LAB — MRSA PCR SCREENING: MRSA by PCR: NEGATIVE

## 2012-12-03 MED ORDER — BIOTENE DRY MOUTH MT LIQD
15.0000 mL | Freq: Two times a day (BID) | OROMUCOSAL | Status: DC
  Administered 2012-12-03 – 2012-12-04 (×3): 15 mL via OROMUCOSAL

## 2012-12-03 MED ORDER — LISINOPRIL 20 MG PO TABS
20.0000 mg | ORAL_TABLET | Freq: Every day | ORAL | Status: DC
  Administered 2012-12-03 – 2012-12-04 (×2): 20 mg via ORAL
  Filled 2012-12-03 (×2): qty 1

## 2012-12-03 MED ORDER — ACETAMINOPHEN 325 MG PO TABS
650.0000 mg | ORAL_TABLET | Freq: Four times a day (QID) | ORAL | Status: DC | PRN
  Administered 2012-12-03: 650 mg via ORAL

## 2012-12-03 MED ORDER — ALBUTEROL SULFATE (5 MG/ML) 0.5% IN NEBU: 2.5000 mg | INHALATION_SOLUTION | Freq: Three times a day (TID) | RESPIRATORY_TRACT | Status: DC

## 2012-12-03 MED ORDER — ACETAMINOPHEN 325 MG PO TABS
ORAL_TABLET | ORAL | Status: AC
  Filled 2012-12-03: qty 2

## 2012-12-03 MED ORDER — IPRATROPIUM BROMIDE 0.02 % IN SOLN
0.5000 mg | Freq: Three times a day (TID) | RESPIRATORY_TRACT | Status: DC
  Administered 2012-12-04 (×2): 0.5 mg via RESPIRATORY_TRACT
  Filled 2012-12-03 (×2): qty 2.5

## 2012-12-03 MED ORDER — ALBUTEROL SULFATE (5 MG/ML) 0.5% IN NEBU
2.5000 mg | INHALATION_SOLUTION | Freq: Four times a day (QID) | RESPIRATORY_TRACT | Status: DC
  Administered 2012-12-03 (×3): 2.5 mg via RESPIRATORY_TRACT
  Filled 2012-12-03 (×4): qty 0.5

## 2012-12-03 MED ORDER — FUROSEMIDE 10 MG/ML IJ SOLN: 40.0000 mg | Freq: Two times a day (BID) | INTRAMUSCULAR | Status: DC

## 2012-12-03 MED ORDER — IPRATROPIUM BROMIDE 0.02 % IN SOLN
0.5000 mg | Freq: Four times a day (QID) | RESPIRATORY_TRACT | Status: DC
  Administered 2012-12-03 (×3): 0.5 mg via RESPIRATORY_TRACT
  Filled 2012-12-03 (×4): qty 2.5

## 2012-12-03 MED ORDER — METOPROLOL TARTRATE 25 MG PO TABS
25.0000 mg | ORAL_TABLET | Freq: Two times a day (BID) | ORAL | Status: DC
  Administered 2012-12-03 – 2012-12-04 (×3): 25 mg via ORAL
  Filled 2012-12-03 (×5): qty 1

## 2012-12-03 MED ORDER — ALBUTEROL SULFATE (5 MG/ML) 0.5% IN NEBU
2.5000 mg | INHALATION_SOLUTION | Freq: Three times a day (TID) | RESPIRATORY_TRACT | Status: DC
  Administered 2012-12-04 (×2): 2.5 mg via RESPIRATORY_TRACT
  Filled 2012-12-03 (×2): qty 0.5

## 2012-12-03 MED ORDER — IPRATROPIUM BROMIDE 0.02 % IN SOLN: 0.5000 mg | Freq: Four times a day (QID) | RESPIRATORY_TRACT | Status: DC

## 2012-12-03 MED ORDER — CHLORHEXIDINE GLUCONATE 0.12 % MT SOLN
15.0000 mL | Freq: Two times a day (BID) | OROMUCOSAL | Status: DC
  Administered 2012-12-03: 15 mL via OROMUCOSAL
  Filled 2012-12-03: qty 15

## 2012-12-03 NOTE — Clinical Social Work Note (Signed)
Clinical Social Worker received inappropriate referral for medication assistance. CSW will defer to CM who can assist with medication concern. CSW will sign off, as social work intervention is no longer needed.   Rozetta Nunnery MSW, [employer] [phone]

## 2012-12-03 NOTE — Clinical Documentation Improvement (Signed)
RESPIRATORY FAILURE DOCUMENTATION CLARIFICATION QUERY   THIS DOCUMENT IS NOT A PERMANENT PART OF THE MEDICAL RECORD  TO RESPOND TO THE THIS QUERY, FOLLOW THE INSTRUCTIONS BELOW:  1. If needed, update documentation for the patient's encounter via the notes activity.  2. Access this query again and click edit on the In [employer].  3. After updating, or not, click F2 to complete all highlighted (required) fields concerning your review. Select "additional documentation in the medical record" OR "no additional documentation provided".  4. Click Sign note button.  5. The deficiency will fall out of your In Basket *Please let us know if you are not able to complete this workflow by phone or e-mail (listed below).  Please update your documentation within the medical record to reflect your response to this query.                                                                                    12/03/12  Dear Dr.PCCM/FM Marton Redwood,  In a better effort to capture your patient's severity of illness, reflect appropriate length of stay and utilization of resources, a review of the patient medical record has revealed the following indicators. Based on your clinical judgment, please clarify and document in a progress note and/or discharge summary the clinical condition associated with the following supporting information: In responding to this query please exercise your independent judgment.  The fact that a query is asked, does not imply that any particular answer is desired or expected.  Possible Clinical Conditions?  Acute Respiratory Failure Acute on Chronic Respiratory Failure Acute Respiratory Insufficiency Other Condition Cannot Clinically Determine   Supporting Information:  Risk Factors:hypertensive crises;   Signs&Symptoms:flash pulmonary edema,    Diagnostics: ABG's     PO2: 98      PCO2: 38.6     PH: 7.263     BiCarb:17.5     Date of  ABG's1/03/2013 ZOX:[id number]  Radiology:Diffuse interstitial opacity with basilar airspace opacities.  Heart size in the upper normal range. The appearance of Kerley B  lines favors acute pulmonary edema, with atypical pneumonia is a  differential diagnostic consideration.  Treatment:  O2 Mode/concentrations: BIPAP 10/4 40%  Respiratory Treatment: albuterol; atrovent  Treatment: lasix                  You may use possible, probable, or suspect with inpatient documentation. possible, probable, suspected diagnoses MUST be documented at the time of discharge  Reviewed: additional documentation in the medical record  Thank You,  Amada Kingfisher RN, BSN, CCM   Clinical Documentation Specialist: Health Information Management [facility] ([place]) [email or web address] [phone]

## 2012-12-03 NOTE — ED Provider Notes (Signed)
CRITICAL CARE Performed by: Pollyann Savoy   Total critical care time: 30  Critical care time was exclusive of separately billable procedures and treating other patients.  Critical care was necessary to treat or prevent imminent or life-threatening deterioration.  Critical care was time spent personally by me on the following activities: development of treatment plan with patient and/or surrogate as well as nursing, discussions with consultants, evaluation of patient's response to treatment, examination of patient, obtaining history from patient or surrogate, ordering and performing treatments and interventions, ordering and review of laboratory studies, ordering and review of radiographic studies, pulse oximetry and re-evaluation of patient's condition.   Belanna Manring B. Bernette Mayers, MD 12/03/12 [phone]

## 2012-12-03 NOTE — Progress Notes (Signed)
Settings changed to BIPAP 10/4 40% per MD order.  MD wanted pt on BIPAP not CPAP. Pt tolerating well at this time.  RT will continue to monitor.

## 2012-12-03 NOTE — Progress Notes (Signed)
Echocardiogram 2D Echocardiogram has been performed.  Sabrina Meyer 12/03/2012, 11:25 AM

## 2012-12-03 NOTE — Clinical Documentation Improvement (Signed)
Hypertension Documentation Clarification Query  THIS DOCUMENT IS NOT A PERMANENT PART OF THE MEDICAL RECORD  TO RESPOND TO THE THIS QUERY, FOLLOW THE INSTRUCTIONS BELOW:  1. If needed, update documentation for the patient's encounter via the notes activity.  2. Access this query again and click edit on the In [employer].  3. After updating, or not, click F2 to complete all highlighted (required) fields concerning your review. Select "additional documentation in the medical record" OR "no additional documentation provided".  4. Click Sign note button.  5. The deficiency will fall out of your In Basket *Please let us know if you are not able to complete this workflow by phone or e-mail (listed below). Please update documentation in a progress note and/or discharge summary. Thanks.         12/03/12  Dear Dr. PCCM/FM Marton Redwood  In an effort to better capture your patient's severity of illness, reflect appropriate length of stay and utilization of resources, a review of the patient medical record has revealed the following indicators. Based on your clinical judgment, please clarify and document in a progress note and/or discharge summary the clinical condition associated with the following supporting information: In responding to this query please exercise your independent judgment.  The fact that a query is asked, does not imply that any particular answer is desired or expected.  Possible Clinical Conditions?   Accelerated Hypertension  Malignant Hypertension  Or Other Condition   Cannot Clinically Determine   Supporting Information:  Risk Factors:medication noncompliance, no PCP follow up, htn,   Signs and Symptoms: SBP/DBP range: 262/188; 240/140; 204/130  Diagnostics: Radiology:Diffuse interstitial opacity with basilar airspace opacities. Heart size in the upper normal range. The appearance of Kerley B lines favors acute pulmonary edema, with atypical pneumonia is a  differential diagnostic consideration.  Treatment :lisinopril, lasix, nitro  Drips: IV Medications: lasix 80 mgs tid  You may use possible, probable, or suspect with inpatient documentation. Possible, probable, suspected diagnoses MUST be documented at the time of discharge.  Reviewed: additional documentation in the medical record  Thank You,  Amada Kingfisher RN, BSN, CCM  Clinical Documentation Specialist: Health Information Management [place] [phone] [email or web address]

## 2012-12-03 NOTE — Progress Notes (Signed)
Name: Sabrina Meyer MRN: [id number] DOB: 1951/03/22 LOS: 1  PCCM RESIDENT DAILY PROGRESS NOTE  History of Present Illness: Ms. Sabrina Meyer is a 62 y.o., female with a PMHX of medication noncompliance, hypertension, multiple prior strokes without residual deficit who is admitted to [facility] on 12/02/2012 acute onset shortness of breath that started approximately 2 hours prior to presentation while the patient was eating dinner - she was otherwise of her normal state of health just prior to this episode. She had associated mild cough during this episode. This prompted her presentation to the ER today. Throughout episode, the patient has remained without symptoms of chest pain, palpitations, vision changes, headache, nausea, vomiting. She has not been sick recently, denies illicit drug use or alcohol abuse. Of note, she does not follow with her regular PCP and is not taking any of the previously prescribed antihypertensives secondary to financial constraints.   Lines / Drains: PIV Right hand 01/05 >>>  PIV Right forearm 01/05 >>>  Cultures: None  Antibiotics: None  Tests / Events: 1/5 admitted to ICU with hypertensive emergency with pulmonary edema.  On Bipap overnight and able to wean off that this morning.    Overnight Events: On bipap and was able to wean this morning.  BP was very high on admission but improved and actually went hypotensive on the nitro drip which was stopped this morning at 3 am.   Vital Signs: Temp:  [97.5 F (36.4 C)-97.8 F (36.6 C)] 97.6 F (36.4 C) (01/06 0800) Pulse Rate:  [59-177] 80  (01/06 0900) Resp:  [21-42] 23  (01/06 0900) BP: (91-262)/(46-188) 123/64 mmHg (01/06 0900) SpO2:  [82 %-100 %] 96 % (01/06 0900) FiO2 (%):  [40 %-80 %] 40 % (01/06 0841) Weight:  [156 lb 15.5 oz (71.2 kg)-158 lb 4.6 oz (71.8 kg)] 158 lb 4.6 oz (71.8 kg) (01/06 0500) I/O last 3 completed shifts: In: 679 [I.V.:171; IV Piggyback:508] Out: 300 [Urine:300]  Physical  Examination: Vitals reviewed. General: resting in bed, NAD, patient is deaf.  HEENT: PERRL, EOMI, no scleral icterus Cardiac: RRR, no rubs, murmurs or gallops Pulm: bibasilar inspiratory crackles.  no wheezes, or rhonchi Abd: soft, nontender, nondistended, BS present Ext: warm and well perfused, no pedal edema Neuro: alert and oriented X3, moving all 4.  Deaf but signs well and can read lips well.   Ventilator settings: Vent Mode:  [-]  FiO2 (%):  [40 %-80 %] 40 %  Basename 12/02/12 1807  PHART 7.263*  PO2ART 98.0  TCO2 19  HCO3 17.5*   Labs and Imaging:   Basic Metabolic Panel:  Lab 12/03/12 7829 12/02/12 1850  NA 139 137  K 4.2 2.9*  CL 101 99  CO2 20 23  GLUCOSE 141* 181*  BUN 20 11  CREATININE 1.10 0.72  CALCIUM 9.3 9.0  MG -- --  PHOS -- --   Liver Function Tests:  Lab 12/03/12 0543 12/02/12 1850  AST 26 23  ALT 15 12  ALKPHOS 62 78  BILITOT 0.5 0.4  PROT 7.0 7.5  ALBUMIN 3.5 3.6   CBC:  Lab 12/03/12 0543 12/02/12 1850  WBC 12.3* 15.9*  NEUTROABS 10.8* 13.6*  HGB 15.5* 17.6*  HCT 45.4 51.6*  MCV 95.6 96.3  PLT 208 223   BNP:  Lab 12/02/12 1851 12/02/12 1751  PROBNP 4273.0* 3502.0*   Fasting Lipid Panel:  Lab 12/03/12 0543  CHOL 290*  HDL 46  LDLCALC 201*  TRIG 217*  CHOLHDL 6.3  LDLDIRECT --  Assessment and Plan: PULMONARY  ASSESSMENT:  1. Pulmonary edema secondary to hypertensive crisis:  Improving this morning.  Able to wean off the bipap at this time to [place].  CXR with marked clearing.    2. Tobacco abuse:  Greatly encouraged today to work on quitting.  Will need resources before discharge.   PLAN:   Titrate O2 for sats >90% Restart home blood pressure medications metoprolol and lisinopril Continue to hold lasix or HCTZ for now. IS for re-expansion. If able to stay off the Bipap will feed and transfer to Tele.  CARDIOVASCULAR  ASSESSMENT: Hypertensive Emergency with pulmonary edema:  Likely in the setting of non-compliance  with continued smoking.  States that she couldn't take her medications or follow with her doctor because of financial problems.  May benefit from Financial counselor to see if she qualifies for medicaid to help with medication and doctor visits.  PLAN:  Monitor BP Restart Home Lisinopril at 20 mg and Metoprolol 25 BID Continue to hold diuretics but low threshold to restart. 2D echo pending.   RENAL  ASSESSMENT:   1. AKI: Mild bump in her creatinine this morning likely secondary to the HTN crisis and hypoxemia yesterday as well as diuresis.   PLAN:   Monitor UOP and creatinine trend for resolution  GASTROINTESTINAL  ASSESSMENT:  none PLAN:   none  HEMATOLOGIC  ASSESSMENT:   1. Polycythemia:  HgB on admission 17.6 which is about baseline.  Likely secondary to tobacco abuse as well as presumed COPD and chronic hypoxia.    PLAN:  Consul to quit smoking.  Patient appears motivated.  INFECTIOUS  ASSESSMENT:  none PLAN:   none  ENDOCRINE  ASSESSMENT:  none   PLAN:   none  NEUROLOGIC  ASSESSMENT:  Hx of multiple strokes:  Maintained on plavix but likely non-compliance secondary to medication expense.   PLAN:   Will need to work with Social work to help afford medications.   CLINICAL SUMMARY: 62 year old woman with PMH of HTN and multiple strokes presents with hypertensive emergency with pulmonary edema.  Edema essentially resolved this morning and she is clinically stable.  Will transfer to Tele today and hospitalist service.  She will need to work to get a PCP and help affording her medications.  Plan to restart her home BP meds today and counsel about quitting smoking.  She appears motivated to quit today.   PRIBULA,CHRISTOPHER 12/03/2012, 11:01 AM   I have interviewed and examined the patient and reviewed the database. I have formulated the assessment and plan as reflected in the note above with amendments made by me.   Billy Fischer, MD;  PCCM service; Mobile  [phone]

## 2012-12-04 DIAGNOSIS — F411 Generalized anxiety disorder: Secondary | ICD-10-CM

## 2012-12-04 MED ORDER — LISINOPRIL 10 MG PO TABS: 10.0000 mg | ORAL_TABLET | Freq: Every day | ORAL | Status: DC

## 2012-12-04 MED ORDER — FUROSEMIDE 20 MG PO TABS: 20.0000 mg | ORAL_TABLET | Freq: Two times a day (BID) | ORAL | Status: DC

## 2012-12-04 MED ORDER — AMOXICILLIN-POT CLAVULANATE 875-125 MG PO TABS: 1.0000 | ORAL_TABLET | Freq: Two times a day (BID) | ORAL | Status: DC

## 2012-12-04 MED ORDER — METOPROLOL TARTRATE 25 MG PO TABS: 25.0000 mg | ORAL_TABLET | Freq: Two times a day (BID) | ORAL | Status: DC

## 2012-12-04 MED ORDER — FUROSEMIDE 10 MG/ML IJ SOLN: 20.0000 mg | Freq: Once | INTRAMUSCULAR | Status: DC

## 2012-12-04 MED ORDER — LOVASTATIN 10 MG PO TABS: 10.0000 mg | ORAL_TABLET | Freq: Every day | ORAL | Status: DC

## 2012-12-04 MED ORDER — ALBUTEROL SULFATE HFA 108 (90 BASE) MCG/ACT IN AERS: 2.0000 | INHALATION_SPRAY | Freq: Four times a day (QID) | RESPIRATORY_TRACT | Status: DC | PRN

## 2012-12-04 MED ORDER — AMLODIPINE BESYLATE 5 MG PO TABS: 5.0000 mg | ORAL_TABLET | Freq: Every day | ORAL | Status: DC

## 2012-12-04 MED ORDER — ASPIRIN 81 MG PO TBEC: 81.0000 mg | DELAYED_RELEASE_TABLET | Freq: Every day | ORAL | Status: AC

## 2012-12-04 NOTE — Discharge Summary (Addendum)
[facility]                                                                                   Sabrina Meyer, is a 62 y.o. female  DOB December 13, 1950  MRN [id number].  Admission date:  12/02/2012  Discharge Date:  12/04/2012  Primary MD  No primary provider on file.  Admitting Physician  Curlene Dolphin, MD  Admission Diagnosis  Hypertensive crisis [401.9] Anxiety [300.00] CHF (congestive heart failure) [428.0] Hyperlipidemia [272.4] Tobacco abuse [305.1] Deaf, nonspeaking [389.7] Depression [311] Hypertension [401.9] Stroke [434.91] Hypertensive emergency  Discharge Diagnosis     Principal Problem:  *Hypertensive emergency Active Problems:  Malignant hypertension  Stroke  Anxiety  Deaf, nonspeaking  Tobacco abuse  Pulmonary edema  Hypokalemia  Hyperlipidemia  Acute respiratory failure   Past Medical History  Diagnosis Date  . Asthma   . Hypertension   . Stroke     Multiple prior strokes, last in 05/2009 (posterior limb of internal capsule), evidence of prior  bilateral thalamic lacunar infarcts  . Anxiety   . Depression   . Deaf, nonspeaking   . Tobacco abuse   . Hyperlipidemia     Past Surgical History  Procedure Date  . Cholecystectomy      Recommendations for primary care physician for things to follow:   Please follow patient's blood pressure and BMP closely   Discharge Diagnoses:   Principal Problem:  *Hypertensive emergency Active Problems:  Malignant hypertension  Stroke  Anxiety  Deaf, nonspeaking  Tobacco abuse  Pulmonary edema  Hypokalemia  Hyperlipidemia  Acute respiratory failure    Discharge Condition: Stable   Diet recommendation: See Discharge Instructions below   Consults PCCM   History of present illness and  Hospital Course:  See H&P, Labs, Consult and Test reports for all details in brief, patient was admitted for hypertensive crisis due to 2 noncompliance with medications, patients elevated  blood pressure caused her to go into pulmonary edema worsening of acute on chronic systolic and diastolic heart failure, she was treated initially in the ICU and then on the floor, I saw her today, on present dose anti hypertensive medications her blood pressure is well controlled, she is completely symptom free with no headache, chest pain no shortness of breath at rest. No edema and minimal rales on exam, Case management will evaluate the patient and assist her in finding a primary care physician, patient was also found to have chronic systolic heart and diastolic failure on her echogram done this admission along with dyslipidemia.   I will place her on beta blocker, ACE, statin along with 81 mg of aspirin, low dose Lasix as she is close to being compensated from CHF standpoint, home o2 if she is SOB on exertion, have counseled her to follow with primary care physician along with outpatient cardiology in the next 7-10 days. She has been counseled extensively in writing to quit smoking.    She had mild productive cough upon admission, cough is much better, could have mild bronchitis as she had some leukocytosis and we'll place her on Augmentin for 5 days.     Today   Subjective:  Sabrina Meyer today has no headache,no chest abdominal pain,no new weakness tingling or numbness, feels much better wants to go home today.    Objective:   Blood pressure 104/62, pulse 67, temperature 97.6 F (36.4 C), temperature source Oral, resp. rate 20, height 5\' 3"  (1.6 m), weight 69.536 kg (153 lb 4.8 oz), SpO2 99.00%.   Intake/Output Summary (Last 24 hours) at 12/04/12 1417 Last data filed at 12/04/12 1300  Gross per 24 hour  Intake    483 ml  Output      1 ml  Net    482 ml    Exam Awake Alert, Oriented *3, No new F.N deficits, Normal affect [place].AT,PERRAL Supple Neck,No JVD, No cervical lymphadenopathy appriciated.  Symmetrical Chest wall movement, Good air movement bilaterally, CTAB RRR,No  Gallops,Rubs or new Murmurs, No Parasternal Heave +ve B.Sounds, Abd Soft, Non tender, No organomegaly appriciated, No rebound -guarding or rigidity. No Cyanosis, Clubbing or edema, No new Rash or bruise  Data Review   Major procedures and Radiology Reports - PLEASE review detailed and final reports for all details in brief -    Echogram  Left ventricle: Wall thickness was increased increased in a pattern of mild to moderate LVH. Systolic function was severely reduced. The estimated ejection fraction was in the range of 25% to 30%. Diffuse hypokinesis. Doppler parameters are consistent with abnormal left ventricular relaxation (grade 1 diastolic dysfunction). Doppler parameters are consistent with high ventricular filling pressure.  - Left atrium: The atrium was moderately to severely dilated.      Portable Chest Xray In Am  12/03/2012  *RADIOLOGY REPORT*  Clinical Data: Pulmonary edema.  PORTABLE CHEST - 1 VIEW  Comparison: 12/02/2012  Findings: Cardiomegaly with vascular congestion and diffuse interstitial prominence, improving since prior study.  Decreasing lung volumes.  Minimal bibasilar atelectasis.  No visible effusions.  IMPRESSION: Worsening lung volumes.  There is improving interstitial prominence/edema.   Original Report Authenticated By: Charlett Nose, M.D.    Dg Chest Portable 1 View  12/02/2012  *RADIOLOGY REPORT*  Clinical Data: Shortness of breath.  Hypertension.  PORTABLE CHEST - 1 VIEW  Comparison: 06/04/2009  Findings: Diffuse interstitial opacities are present with confluent airspace opacities at the lung bases, right greater than left. There is suspicion for Kerley B lines peripherally, but the heart size is only in the upper limits of the normal range rather than overtly enlarged.  Left proximal humeral enchondroma noted.  IMPRESSION:  1.  Diffuse interstitial opacity with basilar airspace opacities. Heart size in the upper normal range.  The appearance of Kerley B lines favors  acute pulmonary edema, with atypical pneumonia is a differential diagnostic consideration.   Original Report Authenticated By: Gaylyn Rong, M.D.     Micro Results      Recent Results (from the past 240 hour(s))  MRSA PCR SCREENING     Status: Normal   Collection Time   12/02/12 10:14 PM      Component Value Range Status Comment   MRSA by PCR NEGATIVE  NEGATIVE Final    Lab Results  Component Value Date   CHOL 290* 12/03/2012   HDL 46 12/03/2012   LDLCALC 161* 12/03/2012   TRIG 217* 12/03/2012   CHOLHDL 6.3 12/03/2012     CBC w Diff: Lab Results  Component Value Date   WBC 12.3* 12/03/2012   HGB 15.5* 12/03/2012   HCT 45.4 12/03/2012   PLT 208 12/03/2012   LYMPHOPCT 10* 12/03/2012   MONOPCT 2* 12/03/2012  EOSPCT 0 12/03/2012   BASOPCT 0 12/03/2012    CMP: Lab Results  Component Value Date   NA 139 12/03/2012   K 4.2 12/03/2012   CL 101 12/03/2012   CO2 20 12/03/2012   BUN 20 12/03/2012   CREATININE 1.10 12/03/2012   PROT 7.0 12/03/2012   ALBUMIN 3.5 12/03/2012   BILITOT 0.5 12/03/2012   ALKPHOS 62 12/03/2012   AST 26 12/03/2012   ALT 15 12/03/2012  .   Discharge Instructions     Follow with Primary MD  in 7 days   Get CBC, CMP, checked 7 days by Primary MD and again as instructed by your Primary MD.    Get Medicines reviewed and adjusted.  Please request your Prim.MD to go over all Hospital Tests and Procedure/Radiological results at the follow up, please get all Hospital records sent to your Prim MD by signing hospital release before you go home.  Activity: As tolerated with Full fall precautions use walker/cane & assistance as needed   Diet:  Heart Healthy  Check your Weight same time everyday, if you gain over 2 pounds, or you develop in leg swelling, experience more shortness of breath or chest pain, call your Primary MD immediately. Follow Cardiac Low Salt Diet and 1.8 lit/day fluid restriction.  Disposition Home    If you experience worsening of your admission symptoms, develop  shortness of breath, life threatening emergency, suicidal or homicidal thoughts you must seek medical attention immediately by calling 911 or calling your MD immediately  if symptoms less severe.  You Must read complete instructions/literature along with all the possible adverse reactions/side effects for all the Medicines you take and that have been prescribed to you. Take any new Medicines after you have completely understood and accpet all the possible adverse reactions/side effects.   Do not drive and provide baby sitting services if your were admitted for syncope or siezures until you have seen by Primary MD or a Neurologist and advised to do so again.  Do not drive when taking Pain medications.    Do not take more than prescribed Pain, Sleep and Anxiety Medications  Special Instructions: If you have smoked or chewed Tobacco  in the last 2 yrs please stop smoking, stop any regular Alcohol  and or any Recreational drug use.  Wear Seat belts while driving.  Follow-up Information    Follow up with Primary care physician as suggested by case management and cardiologist. Schedule an appointment as soon as possible for a visit in 1 week.      Follow up with Arvilla Meres, MD. Schedule an appointment as soon as possible for a visit in 1 week.   Contact information:   [address] [place] [address] [phone]            Discharge Medications     Medication List     As of 12/04/2012  2:17 PM    START taking these medications         albuterol 108 (90 BASE) MCG/ACT inhaler   Commonly known as: PROVENTIL HFA;VENTOLIN HFA   Inhale 2 puffs into the lungs every 6 (six) hours as needed for wheezing or shortness of breath.      amLODipine 5 MG tablet   Commonly known as: NORVASC   Take 1 tablet (5 mg total) by mouth daily.      amoxicillin-clavulanate 875-125 MG per tablet   Commonly known as: AUGMENTIN   Take 1 tablet by  mouth 2 (two) times daily.       aspirin 81 MG EC tablet   Take 1 tablet (81 mg total) by mouth daily. Swallow whole.      furosemide 20 MG tablet   Commonly known as: LASIX   Take 1 tablet (20 mg total) by mouth 2 (two) times daily.      lisinopril 10 MG tablet   Commonly known as: PRINIVIL,ZESTRIL   Take 1 tablet (10 mg total) by mouth daily.      lovastatin 10 MG tablet   Commonly known as: MEVACOR   Take 1 tablet (10 mg total) by mouth at bedtime.      metoprolol tartrate 25 MG tablet   Commonly known as: LOPRESSOR   Take 1 tablet (25 mg total) by mouth 2 (two) times daily.      STOP taking these medications         ibuprofen 200 MG tablet   Commonly known as: ADVIL,MOTRIN          Where to get your medications    These are the prescriptions that you need to pick up. We sent them to a specific pharmacy, so you will need to go there to get them.   [id number] [place], [place] - [address]    [address] [place] [address]    Phone: [phone]        aspirin 81 MG EC tablet   furosemide 20 MG tablet   lovastatin 10 MG tablet         You may get these medications from any pharmacy.         albuterol 108 (90 BASE) MCG/ACT inhaler   amLODipine 5 MG tablet   amoxicillin-clavulanate 875-125 MG per tablet   lisinopril 10 MG tablet   metoprolol tartrate 25 MG tablet               Total Time in preparing paper work, data evaluation and todays exam - 35 minutes  Leroy Sea M.D on 12/04/2012 at 2:17 PM  [facility] Office  [phone]

## 2012-12-04 NOTE — Progress Notes (Signed)
DC IV, DC Tele, DC Home. Discharge instructions and home medications discussed with patient and patient's son. Patient and family member denied any questions or concerns at this time. Patient leaving unit via wheelchair and does not appear in no acute distress.

## 2012-12-04 NOTE — Care Management Note (Signed)
    Page 1 of 2   12/04/2012     2:49:26 PM   CARE MANAGEMENT NOTE 12/04/2012  Patient:  Sabrina Meyer, Sabrina Meyer   Account Number:  [id number]  Date Initiated:  12/04/2012  Documentation initiated by:  Alvira Philips Assessment:   62 yr-old female adm with dx of hypertensive emergency     Action/Plan:   Discharge planning and DME   Anticipated DC Date:  12/04/2012   Anticipated DC Plan:  HOME W HOME HEALTH SERVICES      DC Planning Services  CM consult      PAC Choice  DURABLE MEDICAL EQUIPMENT  HOME HEALTH   Choice offered to / List presented to:  C-1 Patient   DME arranged  OXYGEN      DME agency  [facility]     [facility] arranged  HH-1 RN  HH-6 SOCIAL WORKER      HH agency  [facility]   Status of service:  Completed, signed off Medicare Important Message given?   (If response is "NO", the following Medicare IM given date fields will be blank) Date Medicare IM given:   Date Additional Medicare IM given:    Discharge Disposition:  HOME W HOME HEALTH SERVICES  Per UR Regulation:  Reviewed for med. necessity/level of care/duration of stay  If discussed at Long Length of Stay Meetings, dates discussed:    Comments:  12/04/12 1430 In to speak with pt. and son, using a Sign Language Intepreter about HH services, DME, and acquiring a PCP.  Pt. chose [facility] for [facility] RN and [facility] CSW. Pt. requires oxygen at 2LPM [place].  Pt. stated she has gone to [facility] in past, however, has an outstanding bill of $36 and the physician's will not see her.  Explained to pt. that she can get an appointment with [facility] for follow up.  TC to Lupita Leash, with [facility] to give referral for [facility] RN  and [facility] CSW.  [place], with [facility] was also called for DME oxygen.  Pt. to dc home today with son. Tera Mater, RN, BSN [place] [phone]   12/03/12 [phone] Verdis Prime RN BSN MSN CCM Received referral "medication needs."  Pt deaf, communicated  by writing.  Asked pt if she was having difficulty purchasing meds, she looked puzzled and shook her head no.  Talked with staff RN who states pt has been noncompliant with medi regimen.  Asked pt if she had just not been taking her meds as prescribed, she smiled and shook her head yes.  Asked if she planned to start taking meds as prescribed, she again smiled and shook her head yes.

## 2012-12-04 NOTE — Progress Notes (Signed)
SATURATION QUALIFICATIONS: (This note is used to comply with regulatory documentation for home oxygen)  Patient Saturations on Room Air at Rest = 97%  Patient Saturations on Room Air while Ambulating = 86%  Patient Saturations on 1 Liter of oxygen while Ambulating = 95%

## 2012-12-17 ENCOUNTER — Ambulatory Visit (HOSPITAL_COMMUNITY)
Admission: RE | Admit: 2012-12-17 | Discharge: 2012-12-17 | Disposition: A | Discharge status: Home / Self Care | Payer: Medicare Other | Source: Ambulatory Visit | Attending: Internal Medicine | Admitting: Internal Medicine

## 2012-12-17 ENCOUNTER — Encounter (HOSPITAL_COMMUNITY): Payer: Self-pay | Admitting: *Deleted

## 2012-12-17 ENCOUNTER — Encounter (HOSPITAL_COMMUNITY): Payer: Self-pay

## 2012-12-17 VITALS — BP 202/104 | HR 68 | Ht 63.0 in | Wt 151.8 lb

## 2012-12-17 DIAGNOSIS — D751 Secondary polycythemia: Secondary | ICD-10-CM

## 2012-12-17 DIAGNOSIS — R0600 Dyspnea, unspecified: Secondary | ICD-10-CM

## 2012-12-17 DIAGNOSIS — D45 Polycythemia vera: Secondary | ICD-10-CM

## 2012-12-17 DIAGNOSIS — I10 Essential (primary) hypertension: Secondary | ICD-10-CM

## 2012-12-17 DIAGNOSIS — R0989 Other specified symptoms and signs involving the circulatory and respiratory systems: Secondary | ICD-10-CM | POA: Insufficient documentation

## 2012-12-17 DIAGNOSIS — I5022 Chronic systolic (congestive) heart failure: Secondary | ICD-10-CM

## 2012-12-17 DIAGNOSIS — R0609 Other forms of dyspnea: Secondary | ICD-10-CM | POA: Insufficient documentation

## 2012-12-17 LAB — BASIC METABOLIC PANEL
BUN: 17 mg/dL (ref 6–23)
CO2: 25 mEq/L (ref 19–32)
Calcium: 10.7 mg/dL — ABNORMAL HIGH (ref 8.4–10.5)
Chloride: 96 mEq/L (ref 96–112)
Creatinine, Ser: 0.9 mg/dL (ref 0.50–1.10)

## 2012-12-17 LAB — CBC
HCT: 52.8 % — ABNORMAL HIGH (ref 36.0–46.0)
MCH: 34.7 pg — ABNORMAL HIGH (ref 26.0–34.0)
MCHC: 36.2 g/dL — ABNORMAL HIGH (ref 30.0–36.0)
MCV: 95.8 fL (ref 78.0–100.0)
RDW: 13.3 % (ref 11.5–15.5)

## 2012-12-17 LAB — PROTIME-INR
INR: 0.99 (ref 0.00–1.49)
Prothrombin Time: 13 s (ref 11.6–15.2)

## 2012-12-17 MED ORDER — FUROSEMIDE 40 MG PO TABS: 40.0000 mg | ORAL_TABLET | Freq: Every day | ORAL | Status: DC

## 2012-12-17 MED ORDER — SPIRONOLACTONE 25 MG PO TABS: 25.0000 mg | ORAL_TABLET | Freq: Every day | ORAL | Status: DC

## 2012-12-17 MED ORDER — CARVEDILOL 6.25 MG PO TABS: 6.2500 mg | ORAL_TABLET | Freq: Two times a day (BID) | ORAL | Status: DC

## 2012-12-17 NOTE — Progress Notes (Signed)
Patient ID: [name], female   DOB: [birth year], 62 y.o.   MRN: [id number]  Advanced Heart Failure Team Consult Note  Referring Physician: Hospitalist service  Reason for Consultation: New onset HF  HPI:    Sabrina Meyer is a 62 y/o deaf woman with a h/o COPD (on home O2), tobacco use, severe HTN, CVA (July 2010). Recently admitted with acute HF. Echo done showed EF 25-30%. She was diuresed and BP controlled and discharged home. Prior to admission was off all meds due to finances.   While in hospital also had SVT with HRs up to 170.   Returns today for cardiology evaluation. History obtained through ASL interpreter.  Says she is feeling OK but limits her activity as she fears her breathing will get worse quickly. If she gets anxious, her breathing gets worse and she has to sit down and use oxygen. Also notes DOE on just mild exertion.  No orthopnea/PND or edema. Denies any CP or pressure. Denies palpitations.  Using oxygen at home. Quit smoking since she has been home. Using electronic cigarette. Smoked 1 ppd since age 62.   She is taking meds as prescribed but is about to run out.   Review of Systems: [y] = yes, [ ]  = no   General: Weight gain [ ] ; Weight loss [ ] ; Anorexia [ ] ; Fatigue [ ] ; Fever [ ] ; Chills [ ] ; Weakness [ ]   Cardiac: Chest pain/pressure [ ] ; Resting SOB [ ] ; Exertional SOB [place] ]; Orthopnea [ ] ; Pedal Edema [ ] ; Palpitations [ ] ; Syncope [ ] ; Presyncope [ ] ; Paroxysmal nocturnal dyspnea[ ]   Pulmonary: Cough [place] ]; Wheezing[ ] ; Hemoptysis[ ] ; Sputum [ ] ; Snoring [ ]   GI: Vomiting[ ] ; Dysphagia[ ] ; Melena[ ] ; Hematochezia [ ] ; Heartburn[ ] ; Abdominal pain [ ] ; Constipation [ ] ; Diarrhea [ ] ; BRBPR [ ]   GU: Hematuria[ ] ; Dysuria [ ] ; Nocturia[ ]   Vascular: Pain in legs with walking [ ] ; Pain in feet with lying flat [ ] ; Non-healing sores [ ] ; Stroke [ y]; TIA [ ] ; Slurred speech [ ] ;  Neuro: Headaches[ ] ; Vertigo[ ] ; Seizures[ ] ; Paresthesias[ ] ;Blurred vision [ ] ; Diplopia [  ]; Vision changes [ ]   Ortho/Skin: Arthritis [ ] ; Joint pain [ ] ; Muscle pain [ ] ; Joint swelling [ ] ; Back Pain [ ] ; Rash [ ]   Psych: Depression[ ] ; Anxiety[y ]  Heme: Bleeding problems [ ] ; Clotting disorders [ ] ; Anemia [ ]   Endocrine: Diabetes [ ] ; Thyroid dysfunction[ ]   Home Medications Prior to Admission medications   Medication Sig Start Date End Date Taking? Authorizing Provider  albuterol (PROVENTIL HFA;VENTOLIN HFA) 108 (90 BASE) MCG/ACT inhaler Inhale 2 puffs into the lungs every 6 (six) hours as needed for wheezing or shortness of breath. 12/04/12  Yes Leroy Sea, MD  amLODipine (NORVASC) 5 MG tablet Take 1 tablet (5 mg total) by mouth daily. 12/04/12  Yes Leroy Sea, MD  amoxicillin-clavulanate (AUGMENTIN) 875-125 MG per tablet Take 1 tablet by mouth 2 (two) times daily. 12/04/12  Yes Leroy Sea, MD  aspirin 81 MG EC tablet Take 1 tablet (81 mg total) by mouth daily. Swallow whole. 12/04/12  Yes Leroy Sea, MD  furosemide (LASIX) 20 MG tablet Take 1 tablet (20 mg total) by mouth 2 (two) times daily. 12/04/12  Yes Leroy Sea, MD  lisinopril (PRINIVIL) 10 MG tablet Take 1 tablet (10 mg total) by mouth daily. 12/04/12  Yes Leroy Sea, MD  metoprolol  tartrate (LOPRESSOR) 25 MG tablet Take 1 tablet (25 mg total) by mouth 2 (two) times daily. 12/04/12  Yes Leroy Sea, MD  lovastatin (MEVACOR) 10 MG tablet Take 1 tablet (10 mg total) by mouth at bedtime. 12/04/12   Leroy Sea, MD    Past Medical History: Past Medical History  Diagnosis Date  . Asthma   . Hypertension   . Stroke     Multiple prior strokes, last in 05/2009 (posterior limb of internal capsule), evidence of prior  bilateral thalamic lacunar infarcts  . Anxiety   . Depression   . Deaf, nonspeaking   . Tobacco abuse   . Hyperlipidemia     Past Surgical History: Past Surgical History  Procedure Date  . Cholecystectomy     Family History: Family History  Problem Relation Age of  Onset  . Diabetes Mother   . Diabetes Sister   . Heart disease Mother   . Dementia Sister     Social History: History   Social History  . Marital Status: Divorced    Spouse Name: N/A    Number of Children: 2  . Years of Education: 12th grade   Social History Main Topics  . Smoking status: Current Every Day Smoker -- 1.0 packs/day for 44 years    Types: Cigarettes  . Smokeless tobacco: Current User  . Alcohol Use: No  . Drug Use: No  . Sexually Active: None   Other Topics Concern  . None   Social History Narrative   Lives in [place] and her grandson lives with her.    Allergies:  No Known Allergies  Objective:    Vital Signs:   Pulse Rate:  [68] 68  (01/20 1447) BP: (202)/(104) 202/104 mmHg (01/20 1447) SpO2:  [94 %] 94 % (01/20 1447) Weight:  [151 lb 12.8 oz (68.856 kg)] 151 lb 12.8 oz (68.856 kg) (01/20 1447)    Weight change: Filed Weights   12/17/12 1447  Weight: 151 lb 12.8 oz (68.856 kg)    Physical Exam: General:  Well appearing. No resp difficulty HEENT: normal Neck: supple. JVP flat . Carotids 2+ bilat; no bruits. No lymphadenopathy or thryomegaly appreciated. Cor: PMI nondisplaced. Regular rate & rhythm. No rubs, gallops or murmurs. Lungs: clear with decreased BS Abdomen: soft, nontender, nondistended. No hepatosplenomegaly. No bruits or masses. Good bowel sounds. Extremities: no cyanosis, clubbing, rash, edema Neuro: alert & orientedx3, cranial nerves grossly intact. moves all 4 extremities w/o difficulty. Affect pleasant  ECG: Sinus brady 57 LVH No ST-T wave abnormalities.    BNP: BNP (last 3 results)  Basename 12/02/12 1851 12/02/12 1751  PROBNP 4273.0* 3502.0*

## 2012-12-17 NOTE — Progress Notes (Signed)
Utilization Review Completed.   Sahasra Belue, RN, BSN Nurse Case Manager  [phone]  

## 2012-12-17 NOTE — Patient Instructions (Addendum)
Labs today  Stop Metoprolol   Lisinopril 40 mg daily Carvedilol 6.25 mg Twice daily  Spironolactone 25 mg daily Furosemide (Lasix) 40 mg daily  Your physician has requested that you have a cardiac catheterization. Cardiac catheterization is used to diagnose and/or treat various heart conditions. Doctors may recommend this procedure for a number of different reasons. The most common reason is to evaluate chest pain. Chest pain can be a symptom of coronary artery disease (CAD), and cardiac catheterization can show whether plaque is narrowing or blocking your heart's arteries. This procedure is also used to evaluate the valves, as well as measure the blood flow and oxygen levels in different parts of your heart. For further information please visit [email or web address] Please follow instruction sheet, as given.

## 2012-12-18 ENCOUNTER — Other Ambulatory Visit (HOSPITAL_COMMUNITY): Payer: Self-pay | Admitting: Adult Health

## 2012-12-18 DIAGNOSIS — I509 Heart failure, unspecified: Secondary | ICD-10-CM

## 2012-12-18 DIAGNOSIS — R0600 Dyspnea, unspecified: Secondary | ICD-10-CM

## 2012-12-19 DIAGNOSIS — D751 Secondary polycythemia: Secondary | ICD-10-CM | POA: Insufficient documentation

## 2012-12-19 DIAGNOSIS — I5022 Chronic systolic (congestive) heart failure: Secondary | ICD-10-CM | POA: Insufficient documentation

## 2012-12-19 NOTE — Assessment & Plan Note (Signed)
Suspect this is likely due to chronic hypoxia in setting of COPD. Will need to check ambulatory O2 sats at next visit.

## 2012-12-19 NOTE — Assessment & Plan Note (Signed)
She has new onset with significant LV dysfunction. I think this is likely related to HTN-cardiomyopathy but she has multiple risk factors for CAD. We discussed need for cath vs stress testing and we have decided upon R and L heart cath later this week. Her BP also remains poorly controlled. We have decided to make wholesale changes in her medical regimen and we will provide her with a month's supply of meds today. Her new regimen will start with:  Lisinopril 40 mg daily Carvedilol 6.25 mg Twice daily  Spironolactone 25 mg daily Furosemide (Lasix) 40 mg daily  We will get labs. Reinforced need for daily weights and reviewed use of sliding scale diuretics. She will need close f/u.

## 2012-12-19 NOTE — Assessment & Plan Note (Addendum)
BP is poorly controlled. Suspect she will need at least 4 agents to control. Have altered her regiment to help with HF and HTN. We will check her renals at cath to exclude RAS. We will see her back frequently to get BP under control. Check plasma renin/aldo ratio.

## 2012-12-24 ENCOUNTER — Encounter (HOSPITAL_BASED_OUTPATIENT_CLINIC_OR_DEPARTMENT_OTHER)
Admission: RE | Disposition: A | Discharge status: Home / Self Care | Payer: Self-pay | Source: Ambulatory Visit | Attending: Internal Medicine

## 2012-12-24 ENCOUNTER — Inpatient Hospital Stay (HOSPITAL_BASED_OUTPATIENT_CLINIC_OR_DEPARTMENT_OTHER)
Admission: RE | Admit: 2012-12-24 | Discharge: 2012-12-24 | Disposition: A | Discharge status: Home / Self Care | Payer: Medicare Other | Source: Ambulatory Visit | Attending: Internal Medicine | Admitting: Internal Medicine

## 2012-12-24 ENCOUNTER — Other Ambulatory Visit: Payer: Self-pay

## 2012-12-24 DIAGNOSIS — Z9981 Dependence on supplemental oxygen: Secondary | ICD-10-CM | POA: Insufficient documentation

## 2012-12-24 DIAGNOSIS — I701 Atherosclerosis of renal artery: Secondary | ICD-10-CM

## 2012-12-24 DIAGNOSIS — I2510 Atherosclerotic heart disease of native coronary artery without angina pectoris: Secondary | ICD-10-CM | POA: Insufficient documentation

## 2012-12-24 DIAGNOSIS — Z8673 Personal history of transient ischemic attack (TIA), and cerebral infarction without residual deficits: Secondary | ICD-10-CM | POA: Insufficient documentation

## 2012-12-24 DIAGNOSIS — H9190 Unspecified hearing loss, unspecified ear: Secondary | ICD-10-CM | POA: Insufficient documentation

## 2012-12-24 DIAGNOSIS — I509 Heart failure, unspecified: Secondary | ICD-10-CM | POA: Insufficient documentation

## 2012-12-24 DIAGNOSIS — J449 Chronic obstructive pulmonary disease, unspecified: Secondary | ICD-10-CM | POA: Insufficient documentation

## 2012-12-24 DIAGNOSIS — I10 Essential (primary) hypertension: Secondary | ICD-10-CM | POA: Insufficient documentation

## 2012-12-24 DIAGNOSIS — R0600 Dyspnea, unspecified: Secondary | ICD-10-CM

## 2012-12-24 DIAGNOSIS — I428 Other cardiomyopathies: Secondary | ICD-10-CM | POA: Insufficient documentation

## 2012-12-24 DIAGNOSIS — J4489 Other specified chronic obstructive pulmonary disease: Secondary | ICD-10-CM | POA: Insufficient documentation

## 2012-12-24 LAB — POCT I-STAT 3, VENOUS BLOOD GAS (G3P V)
Acid-base deficit: 3 mmol/L — ABNORMAL HIGH (ref 0.0–2.0)
pCO2, Ven: 41 mmHg — ABNORMAL LOW (ref 45.0–50.0)
pH, Ven: 7.33 — ABNORMAL HIGH (ref 7.250–7.300)
pH, Ven: 7.335 — ABNORMAL HIGH (ref 7.250–7.300)
pO2, Ven: 33 mmHg (ref 30.0–45.0)
pO2, Ven: 34 mmHg (ref 30.0–45.0)

## 2012-12-24 LAB — POCT I-STAT 3, ART BLOOD GAS (G3+)
Acid-base deficit: 3 mmol/L — ABNORMAL HIGH (ref 0.0–2.0)
Bicarbonate: 21.7 mEq/L (ref 20.0–24.0)
TCO2: 23 mmol/L (ref 0–100)
pH, Arterial: 7.358 (ref 7.350–7.450)

## 2012-12-24 SURGERY — JV LEFT AND RIGHT HEART CATHETERIZATION WITH CORONARY ANGIOGRAM
Anesthesia: Moderate Sedation

## 2012-12-24 MED ORDER — SODIUM CHLORIDE 0.9 % IJ SOLN
3.0000 mL | INTRAMUSCULAR | Status: DC | PRN
Start: 2012-12-24 — End: 2012-12-24

## 2012-12-24 MED ORDER — SODIUM CHLORIDE 0.9 % IV SOLN: 250.0000 mL | INTRAVENOUS | Status: DC | PRN

## 2012-12-24 MED ORDER — ONDANSETRON HCL 4 MG/2ML IJ SOLN: 4.0000 mg | Freq: Four times a day (QID) | INTRAMUSCULAR | Status: DC | PRN

## 2012-12-24 MED ORDER — SODIUM CHLORIDE 0.9 % IV SOLN
INTRAVENOUS | Status: DC
  Administered 2012-12-24: 10:00:00 via INTRAVENOUS

## 2012-12-24 MED ORDER — ASPIRIN 81 MG PO CHEW
324.0000 mg | CHEWABLE_TABLET | ORAL | Status: AC
  Administered 2012-12-24: 324 mg via ORAL

## 2012-12-24 MED ORDER — ACETAMINOPHEN 325 MG PO TABS: 650.0000 mg | ORAL_TABLET | ORAL | Status: DC | PRN

## 2012-12-24 MED ORDER — SODIUM CHLORIDE 0.9 % IV SOLN: INTRAVENOUS | Status: AC

## 2012-12-24 MED ORDER — SODIUM CHLORIDE 0.9 % IJ SOLN: 3.0000 mL | Freq: Two times a day (BID) | INTRAMUSCULAR | Status: DC

## 2012-12-24 MED ORDER — FENTANYL CITRATE 0.05 MG/ML IJ SOLN
50.0000 ug | INTRAMUSCULAR | Status: DC | PRN
  Administered 2012-12-24: 50 ug via INTRAVENOUS

## 2012-12-24 NOTE — Interval H&P Note (Signed)
History and Physical Interval Note:  12/24/2012 10:42 AM  Sabrina Meyer  has presented today for surgery, with the diagnosis of dyspnes, CHF  The various methods of treatment have been discussed with the patient and family. After consideration of risks, benefits and other options for treatment, the patient has consented to  Procedure(s) (LRB) with comments: JV LEFT AND RIGHT HEART CATHETERIZATION WITH CORONARY ANGIOGRAM (N/A) as a surgical intervention .  The patient's history has been reviewed, patient examined, no change in status, stable for surgery.  I have reviewed the patient's chart and labs.  Questions were answered to the patient's satisfaction.     Daniel Bensimhon

## 2012-12-24 NOTE — Progress Notes (Signed)
Bedrest begins @ 1210, tegaderm dressing applied to right groin site which is level 0 by Theodoro Grist.

## 2012-12-24 NOTE — CV Procedure (Addendum)
Cardiac Cath Procedure Note  Indication: HF & severe HTN   Procedures performed:  1) Right heart cathererization 2) Selective coronary angiography 3) Left heart catheterization 4) Left ventriculogram 5) Bilateral selective renal artery angiography  Description of procedure:     The risks and indication of the procedure were explained. Consent was signed and placed on the chart. An appropriate timeout was taken prior to the procedure. The right groin was prepped and draped in the routine sterile fashion and anesthetized with 1% local lidocaine.   A 5 FR arterial sheath was placed in the right femoral artery using a modified Seldinger technique. Standard catheters including a JL4, JR4 and angled pigtail were used. All catheter exchanges were made over a wire. A 7 FR venous sheath was placed in the right femoral vein using a modified Seldinger technique. A standard Swan-Ganz catheter was used for the procedure.   The JR4 was used to selective cannulate the renal arteries. Each side had dual renal arteries.   Complications:  None apparent  Total contrast: 95cc  Findings:  RA = 3 RV = 30/2/3 PA = 29/7 (17) PCW = 7 Ao = 115/68 (86) LV = 115/16/18 Fick cardiac output/index = 2.9/1.7 Thermo CO/CI =  3.3/1.9 PVR = 3.4 (Fick) FA sat = 99% PA sat = 59%, 61%  There was no signficant gradient across the aortic valve on pullback.  Left main: Mild plaquing  LAD: Gives off a diagonal in midsection. LAD has 40% ostial plaque. Diffuse 30-40% throughout with 50-60% at take-off of diagonal.  LCX: Gives off tiny OM-1, large OM-2 and moderate-sized OM-3. In LCX proper there is diffuse 30%. At take-off of large OM-2 there is an aneurysmal segment with contrast streaming probably about 30-40% stenosis. In proximal OM-1 40%. Mild diffuse plaque in OM-3.  RCA: Large dominant vessel. Moderate sized PDA with large PL going out to lateral wall. Ectatic. Diffuse moderate disease. 40% throughout  proximal and distal vessel. Mid RCA with focal 60-70% lesion. In midsection of PL there is 60-70% lesion.   LV-gram done in the RAO projection: Ejection fraction hard to assess due to ectopy. Estimated 55% with mild inferior HK  Renals: Dual renal arteries bilaterally. All widely patent  Assessment: 1. Diffuse primarily non-obstructive CAD with borderline lesion in mid RCA. 2. Apparent recovery of LV function with estimated EF 55% 3. Normal R & L sided filling pressures with low cardiac output 4. H/o uncontrolled HTN. Now improved. 5. Normal renal arteries  Plan/Discussion:  Suspect her cardiomyopathy was due to uncontrolled HTN. BP now much improved and EF appears to have normalized. She does have diffuse CAD but no critical lesions. Mid RCA lesion is borderline. Will treat medically for now and review with intervention team. If develops CP will need Myoview or FFR.  Cardiac output is low - will need to follow closely. Continue to titrate HF meds. No need for inotropes at this point.   Docia Klar,MD 12:04 PM

## 2012-12-24 NOTE — H&P (View-Only) (Signed)
Patient ID: Sabrina Meyer, female   DOB: 06/25/1951, 61 y.o.   MRN: [id number]  Advanced Heart Failure Team Consult Note  Referring Physician: Hospitalist service  Reason for Consultation: New onset HF  HPI:    Labrittany is a 61 y/o deaf woman with a h/o COPD (on home O2), tobacco use, severe HTN, CVA (July 2010). Recently admitted with acute HF. Echo done showed EF 25-30%. She was diuresed and BP controlled and discharged home. Prior to admission was off all meds due to finances.   While in hospital also had SVT with HRs up to 170.   Returns today for cardiology evaluation. History obtained through ASL interpreter.  Says she is feeling OK but limits her activity as she fears her breathing will get worse quickly. If she gets anxious, her breathing gets worse and she has to sit down and use oxygen. Also notes DOE on just mild exertion.  No orthopnea/PND or edema. Denies any CP or pressure. Denies palpitations.  Using oxygen at home. Quit smoking since she has been home. Using electronic cigarette. Smoked 1 ppd since age 17.   She is taking meds as prescribed but is about to run out.   Review of Systems: [y] = yes, [ ] = no   General: Weight gain [ ]; Weight loss [ ]; Anorexia [ ]; Fatigue [ ]; Fever [ ]; Chills [ ]; Weakness [ ]  Cardiac: Chest pain/pressure [ ]; Resting SOB [ ]; Exertional SOB [y ]; Orthopnea [ ]; Pedal Edema [ ]; Palpitations [ ]; Syncope [ ]; Presyncope [ ]; Paroxysmal nocturnal dyspnea[ ]  Pulmonary: Cough [y ]; Wheezing[ ]; Hemoptysis[ ]; Sputum [ ]; Snoring [ ]  GI: Vomiting[ ]; Dysphagia[ ]; Melena[ ]; Hematochezia [ ]; Heartburn[ ]; Abdominal pain [ ]; Constipation [ ]; Diarrhea [ ]; BRBPR [ ]  GU: Hematuria[ ]; Dysuria [ ]; Nocturia[ ]  Vascular: Pain in legs with walking [ ]; Pain in feet with lying flat [ ]; Non-healing sores [ ]; Stroke [ y]; TIA [ ]; Slurred speech [ ];  Neuro: Headaches[ ]; Vertigo[ ]; Seizures[ ]; Paresthesias[ ];Blurred vision [ ]; Diplopia [  ]; Vision changes [ ]  Ortho/Skin: Arthritis [ ]; Joint pain [ ]; Muscle pain [ ]; Joint swelling [ ]; Back Pain [ ]; Rash [ ]  Psych: Depression[ ]; Anxiety[y ]  Heme: Bleeding problems [ ]; Clotting disorders [ ]; Anemia [ ]  Endocrine: Diabetes [ ]; Thyroid dysfunction[ ]  Home Medications Prior to Admission medications   Medication Sig Start Date End Date Taking? Authorizing Provider  albuterol (PROVENTIL HFA;VENTOLIN HFA) 108 (90 BASE) MCG/ACT inhaler Inhale 2 puffs into the lungs every 6 (six) hours as needed for wheezing or shortness of breath. 12/04/12  Yes Prashant K Singh, MD  amLODipine (NORVASC) 5 MG tablet Take 1 tablet (5 mg total) by mouth daily. 12/04/12  Yes Prashant K Singh, MD  amoxicillin-clavulanate (AUGMENTIN) 875-125 MG per tablet Take 1 tablet by mouth 2 (two) times daily. 12/04/12  Yes Prashant K Singh, MD  aspirin 81 MG EC tablet Take 1 tablet (81 mg total) by mouth daily. Swallow whole. 12/04/12  Yes Prashant K Singh, MD  furosemide (LASIX) 20 MG tablet Take 1 tablet (20 mg total) by mouth 2 (two) times daily. 12/04/12  Yes Prashant K Singh, MD  lisinopril (PRINIVIL) 10 MG tablet Take 1 tablet (10 mg total) by mouth daily. 12/04/12  Yes Prashant K Singh, MD  metoprolol   tartrate (LOPRESSOR) 25 MG tablet Take 1 tablet (25 mg total) by mouth 2 (two) times daily. 12/04/12  Yes Prashant K Singh, MD  lovastatin (MEVACOR) 10 MG tablet Take 1 tablet (10 mg total) by mouth at bedtime. 12/04/12   Prashant K Singh, MD    Past Medical History: Past Medical History  Diagnosis Date  . Asthma   . Hypertension   . Stroke     Multiple prior strokes, last in 05/2009 (posterior limb of internal capsule), evidence of prior  bilateral thalamic lacunar infarcts  . Anxiety   . Depression   . Deaf, nonspeaking   . Tobacco abuse   . Hyperlipidemia     Past Surgical History: Past Surgical History  Procedure Date  . Cholecystectomy     Family History: Family History  Problem Relation Age of  Onset  . Diabetes Mother   . Diabetes Sister   . Heart disease Mother   . Dementia Sister     Social History: History   Social History  . Marital Status: Divorced    Spouse Name: N/A    Number of Children: 2  . Years of Education: 12th grade   Social History Main Topics  . Smoking status: Current Every Day Smoker -- 1.0 packs/day for 44 years    Types: Cigarettes  . Smokeless tobacco: Current User  . Alcohol Use: No  . Drug Use: No  . Sexually Active: None   Other Topics Concern  . None   Social History Narrative   Lives in [place] and her grandson lives with her.    Allergies:  No Known Allergies  Objective:    Vital Signs:   Pulse Rate:  [68] 68  (01/20 1447) BP: (202)/(104) 202/104 mmHg (01/20 1447) SpO2:  [94 %] 94 % (01/20 1447) Weight:  [151 lb 12.8 oz (68.856 kg)] 151 lb 12.8 oz (68.856 kg) (01/20 1447)    Weight change: Filed Weights   12/17/12 1447  Weight: 151 lb 12.8 oz (68.856 kg)    Physical Exam: General:  Well appearing. No resp difficulty HEENT: normal Neck: supple. JVP flat . Carotids 2+ bilat; no bruits. No lymphadenopathy or thryomegaly appreciated. Cor: PMI nondisplaced. Regular rate & rhythm. No rubs, gallops or murmurs. Lungs: clear with decreased BS Abdomen: soft, nontender, nondistended. No hepatosplenomegaly. No bruits or masses. Good bowel sounds. Extremities: no cyanosis, clubbing, rash, edema Neuro: alert & orientedx3, cranial nerves grossly intact. moves all 4 extremities w/o difficulty. Affect pleasant  ECG: Sinus brady 57 LVH No ST-T wave abnormalities.    BNP: BNP (last 3 results)  Basename 12/02/12 1851 12/02/12 1751  PROBNP 4273.0* 3502.0*       

## 2013-01-10 ENCOUNTER — Encounter (HOSPITAL_COMMUNITY): Payer: Medicare Other

## 2013-01-15 ENCOUNTER — Ambulatory Visit (HOSPITAL_COMMUNITY): Payer: Medicare Other | Attending: Internal Medicine

## 2013-01-29 ENCOUNTER — Encounter (HOSPITAL_COMMUNITY): Payer: Self-pay | Admitting: *Deleted

## 2013-12-20 ENCOUNTER — Emergency Department (HOSPITAL_COMMUNITY): Payer: Medicare Other

## 2013-12-20 ENCOUNTER — Emergency Department (HOSPITAL_COMMUNITY)
Admission: EM | Admit: 2013-12-20 | Discharge: 2013-12-20 | Disposition: A | Discharge status: Home / Self Care | Payer: Medicare Other | Attending: Emergency Medicine | Admitting: Emergency Medicine

## 2013-12-20 ENCOUNTER — Encounter (HOSPITAL_COMMUNITY): Payer: Self-pay | Admitting: Emergency Medicine

## 2013-12-20 DIAGNOSIS — Z8673 Personal history of transient ischemic attack (TIA), and cerebral infarction without residual deficits: Secondary | ICD-10-CM | POA: Insufficient documentation

## 2013-12-20 DIAGNOSIS — F17200 Nicotine dependence, unspecified, uncomplicated: Secondary | ICD-10-CM | POA: Insufficient documentation

## 2013-12-20 DIAGNOSIS — Z862 Personal history of diseases of the blood and blood-forming organs and certain disorders involving the immune mechanism: Secondary | ICD-10-CM | POA: Insufficient documentation

## 2013-12-20 DIAGNOSIS — I10 Essential (primary) hypertension: Secondary | ICD-10-CM | POA: Insufficient documentation

## 2013-12-20 DIAGNOSIS — Z8639 Personal history of other endocrine, nutritional and metabolic disease: Secondary | ICD-10-CM | POA: Insufficient documentation

## 2013-12-20 DIAGNOSIS — Z8669 Personal history of other diseases of the nervous system and sense organs: Secondary | ICD-10-CM | POA: Insufficient documentation

## 2013-12-20 DIAGNOSIS — I509 Heart failure, unspecified: Secondary | ICD-10-CM | POA: Insufficient documentation

## 2013-12-20 DIAGNOSIS — J45901 Unspecified asthma with (acute) exacerbation: Secondary | ICD-10-CM | POA: Insufficient documentation

## 2013-12-20 DIAGNOSIS — Z8659 Personal history of other mental and behavioral disorders: Secondary | ICD-10-CM | POA: Insufficient documentation

## 2013-12-20 LAB — COMPREHENSIVE METABOLIC PANEL
ALK PHOS: 98 U/L (ref 39–117)
ALT: 16 U/L (ref 0–35)
AST: 19 U/L (ref 0–37)
Albumin: 4 g/dL (ref 3.5–5.2)
BILIRUBIN TOTAL: 0.6 mg/dL (ref 0.3–1.2)
BUN: 14 mg/dL (ref 6–23)
CO2: 25 meq/L (ref 19–32)
Calcium: 9.4 mg/dL (ref 8.4–10.5)
Chloride: 103 mEq/L (ref 96–112)
Creatinine, Ser: 0.87 mg/dL (ref 0.50–1.10)
GFR, EST AFRICAN AMERICAN: 82 mL/min — AB (ref >90)
GFR, EST NON AFRICAN AMERICAN: 70 mL/min — AB (ref >90)
GLUCOSE: 116 mg/dL — AB (ref 70–99)
POTASSIUM: 3.4 meq/L — AB (ref 3.7–5.3)
SODIUM: 143 meq/L (ref 137–147)
Total Protein: 7.9 g/dL (ref 6.0–8.3)

## 2013-12-20 LAB — CBC WITH DIFFERENTIAL/PLATELET
Basophils Absolute: 0.1 10*3/uL (ref 0.0–0.1)
Basophils Relative: 1 % (ref 0–1)
EOS PCT: 1 % (ref 0–5)
Eosinophils Absolute: 0.1 10*3/uL (ref 0.0–0.7)
HCT: 48.8 % — ABNORMAL HIGH (ref 36.0–46.0)
HEMOGLOBIN: 17.3 g/dL — AB (ref 12.0–15.0)
LYMPHS ABS: 2.6 10*3/uL (ref 0.7–4.0)
LYMPHS PCT: 24 % (ref 12–46)
MCH: 33.7 pg (ref 26.0–34.0)
MCHC: 35.5 g/dL (ref 30.0–36.0)
MCV: 94.9 fL (ref 78.0–100.0)
Monocytes Absolute: 0.4 10*3/uL (ref 0.1–1.0)
Monocytes Relative: 4 % (ref 3–12)
NEUTROS PCT: 71 % (ref 43–77)
Neutro Abs: 7.7 10*3/uL (ref 1.7–7.7)
PLATELETS: 201 10*3/uL (ref 150–400)
RBC: 5.14 MIL/uL — AB (ref 3.87–5.11)
RDW: 13.2 % (ref 11.5–15.5)
WBC: 10.9 10*3/uL — AB (ref 4.0–10.5)

## 2013-12-20 LAB — TROPONIN I: Troponin I: <0.3 ng/mL (ref <0.30)

## 2013-12-20 LAB — PRO B NATRIURETIC PEPTIDE: Pro B Natriuretic peptide (BNP): 2820 pg/mL — ABNORMAL HIGH (ref 0–125)

## 2013-12-20 MED ORDER — IPRATROPIUM-ALBUTEROL 0.5-2.5 (3) MG/3ML IN SOLN: 3.0000 mL | RESPIRATORY_TRACT | Status: DC

## 2013-12-20 MED ORDER — FUROSEMIDE 10 MG/ML IJ SOLN
80.0000 mg | Freq: Once | INTRAMUSCULAR | Status: AC
  Administered 2013-12-20: 80 mg via INTRAVENOUS
  Filled 2013-12-20: qty 8

## 2013-12-20 MED ORDER — ALBUTEROL SULFATE (2.5 MG/3ML) 0.083% IN NEBU: 5.0000 mg | INHALATION_SOLUTION | Freq: Once | RESPIRATORY_TRACT | Status: DC

## 2013-12-20 MED ORDER — LISINOPRIL 20 MG PO TABS: 20.0000 mg | ORAL_TABLET | Freq: Every day | ORAL | Status: AC

## 2013-12-20 MED ORDER — METHYLPREDNISOLONE SODIUM SUCC 125 MG IJ SOLR
125.0000 mg | Freq: Once | INTRAMUSCULAR | Status: AC
  Administered 2013-12-20: 125 mg via INTRAVENOUS
  Filled 2013-12-20: qty 2

## 2013-12-20 MED ORDER — IPRATROPIUM BROMIDE 0.02 % IN SOLN: 0.5000 mg | Freq: Once | RESPIRATORY_TRACT | Status: DC

## 2013-12-20 MED ORDER — FUROSEMIDE 20 MG PO TABS: 20.0000 mg | ORAL_TABLET | Freq: Every day | ORAL | Status: AC

## 2013-12-20 MED ORDER — IPRATROPIUM-ALBUTEROL 0.5-2.5 (3) MG/3ML IN SOLN
3.0000 mL | Freq: Once | RESPIRATORY_TRACT | Status: AC
  Administered 2013-12-20: 3 mL via RESPIRATORY_TRACT
  Filled 2013-12-20: qty 3

## 2013-12-20 MED ORDER — ALBUTEROL SULFATE (2.5 MG/3ML) 0.083% IN NEBU
2.5000 mg | INHALATION_SOLUTION | Freq: Once | RESPIRATORY_TRACT | Status: AC
  Administered 2013-12-20: 2.5 mg via RESPIRATORY_TRACT
  Filled 2013-12-20: qty 3

## 2013-12-20 NOTE — ED Notes (Signed)
Patient having trouble breathing, expiratory wheezing. Patient having expiratory wheezing. Patient deaf. Communicates by writing.

## 2013-12-20 NOTE — ED Provider Notes (Signed)
CSN: [id number]     Arrival date & time 12/20/13  1843 History   First MD Initiated Contact with Patient 12/20/13 1856     Chief Complaint  Patient presents with  . Shortness of Breath   (Consider location/radiation/quality/duration/timing/severity/associated sxs/prior Treatment) Patient is a 63 y.o. female presenting with shortness of breath. The history is provided by the patient (the pt became sob today.  she does not take any of her medicines.  pt has a hx of chf).  Shortness of Breath Severity:  Moderate Onset quality:  Sudden Timing:  Intermittent Progression:  Improving Chronicity:  Recurrent Context: activity   Associated symptoms: no abdominal pain, no chest pain, no cough, no headaches and no rash     Past Medical History  Diagnosis Date  . Asthma   . Hypertension   . Stroke     Multiple prior strokes, last in 05/2009 (posterior limb of internal capsule), evidence of prior  bilateral thalamic lacunar infarcts  . Anxiety   . Depression   . Deaf, nonspeaking   . Tobacco abuse   . Hyperlipidemia    Past Surgical History  Procedure Laterality Date  . Cholecystectomy     Family History  Problem Relation Age of Onset  . Diabetes Mother   . Diabetes Sister   . Heart disease Mother   . Dementia Sister    History  Substance Use Topics  . Smoking status: Current Every Day Smoker -- 1.00 packs/day for 44 years    Types: Cigarettes  . Smokeless tobacco: Current User  . Alcohol Use: No   OB History   Grav Para Term Preterm Abortions TAB SAB Ect Mult Living                 Review of Systems  Constitutional: Negative for appetite change and fatigue.  HENT: Negative for congestion, ear discharge and sinus pressure.   Eyes: Negative for discharge.  Respiratory: Positive for shortness of breath. Negative for cough.   Cardiovascular: Negative for chest pain.  Gastrointestinal: Negative for abdominal pain and diarrhea.  Genitourinary: Negative for frequency and  hematuria.  Musculoskeletal: Negative for back pain.  Skin: Negative for rash.  Neurological: Negative for seizures and headaches.  Psychiatric/Behavioral: Negative for hallucinations.    Allergies  Review of patient's allergies indicates no known allergies.  Home Medications   Current Outpatient Rx  Name  Route  Sig  Dispense  Refill  . aspirin 81 MG EC tablet   Oral   Take 1 tablet (81 mg total) by mouth daily. Swallow whole.   30 tablet   1   . furosemide (LASIX) 20 MG tablet   Oral   Take 1 tablet (20 mg total) by mouth daily.   10 tablet   0   . lisinopril (PRINIVIL,ZESTRIL) 20 MG tablet   Oral   Take 1 tablet (20 mg total) by mouth daily.   30 tablet   1    BP 177/128  Pulse 111  Temp(Src) 98.5 F (36.9 C) (Oral)  Resp 29  Wt 151 lb (68.493 kg)  SpO2 92% Physical Exam  Constitutional: She is oriented to person, place, and time. She appears well-developed.  HENT:  Head: Normocephalic.  Eyes: Conjunctivae and EOM are normal. No scleral icterus.  Neck: Neck supple. No thyromegaly present.  Cardiovascular: Normal rate and regular rhythm.  Exam reveals no gallop and no friction rub.   No murmur heard. Pulmonary/Chest: No stridor. She has wheezes. She has no rales.  She exhibits no tenderness.  Abdominal: She exhibits no distension. There is no tenderness. There is no rebound.  Musculoskeletal: Normal range of motion. She exhibits no edema.  Lymphadenopathy:    She has no cervical adenopathy.  Neurological: She is oriented to person, place, and time. She exhibits normal muscle tone. Coordination normal.  Skin: No rash noted. No erythema.  Psychiatric: She has a normal mood and affect. Her behavior is normal.    ED Course  Procedures (including critical care time) Labs Review Labs Reviewed  PRO B NATRIURETIC PEPTIDE - Abnormal; Notable for the following:    Pro B Natriuretic peptide (BNP) 2820.0 (*)    All other components within normal limits  CBC WITH  DIFFERENTIAL - Abnormal; Notable for the following:    WBC 10.9 (*)    RBC 5.14 (*)    Hemoglobin 17.3 (*)    HCT 48.8 (*)    All other components within normal limits  COMPREHENSIVE METABOLIC PANEL - Abnormal; Notable for the following:    Potassium 3.4 (*)    Glucose, Bld 116 (*)    GFR calc non Af Amer 70 (*)    GFR calc Af Amer 82 (*)    All other components within normal limits  TROPONIN I   Imaging Review Dg Chest Portable 1 View  12/20/2013   CLINICAL DATA:  Expiratory wheezing  EXAM: PORTABLE CHEST - 1 VIEW  COMPARISON:  12/03/2012  FINDINGS: Mild cardiomegaly noted. Interval development of diffuse prominence of reticular markings with Kerley B-lines and trace pleural effusions. Probable left humeral bone island. No focal pulmonary opacity.  IMPRESSION: Findings suggest interstitial pulmonary edema with trace effusions and cardiomegaly.   Electronically Signed   By: Conchita Paris M.D.   On: 12/20/2013 19:09    EKG Interpretation    Date/Time:  Friday December 20 2013 19:19:29 EST Ventricular Rate:  104 PR Interval:  128 QRS Duration: 92 QT Interval:  370 QTC Calculation: 486 R Axis:   38 Text Interpretation:  Sinus tachycardia Possible Left atrial enlargement Left ventricular hypertrophy with repolarization abnormality Abnormal ECG When compared with ECG of 24-Dec-2012 12:05, Vent. rate has increased BY  44 BPM ST now depressed in Lateral leads Nonspecific T wave abnormality has replaced inverted T waves in Lateral leads Confirmed by Rocky Gladden  MD, Emeri Estill ([id number]) on 12/20/2013 10:22:33 PM          pt improved with tx  MDM   1. CHF (congestive heart failure)   2. HTN (hypertension)       Maudry Diego, MD 12/20/13 2236

## 2013-12-20 NOTE — Discharge Instructions (Signed)
Follow up with a family md next week °

## 2014-06-28 DEATH — deceased

## 2014-08-28 IMAGING — CR DG CHEST 1V PORT
1 series · 1 of 1 positions shown · non-contrast
Comparison: 12/03/2012

CLINICAL DATA: Expiratory wheezing

EXAM:
PORTABLE CHEST - 1 VIEW

[portable]
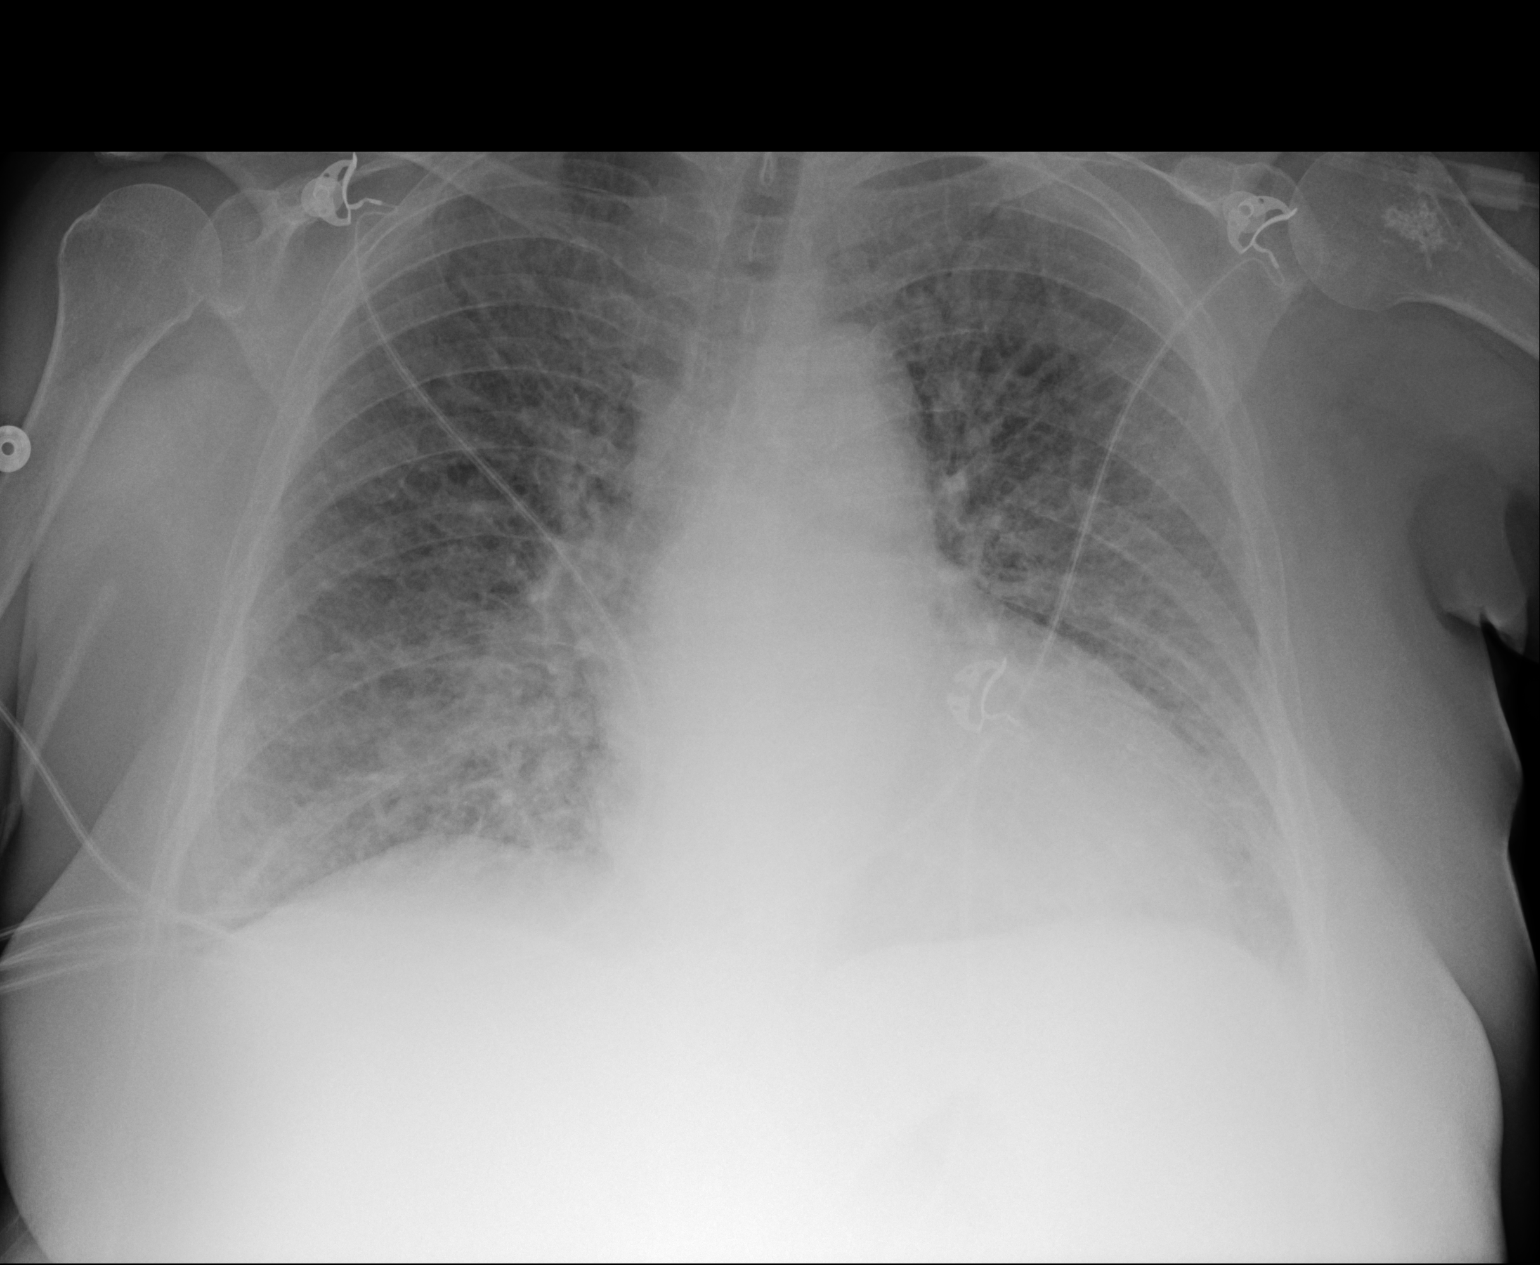

[1 of 1 positions shown; findings below may reference images not displayed]

FINDINGS: Mild cardiomegaly noted. Interval development of diffuse prominence
of reticular markings with Kerley B-lines and trace pleural
effusions. Probable left humeral bone island. No focal pulmonary
opacity.
IMPRESSION: Findings suggest interstitial pulmonary edema with trace effusions
and cardiomegaly.
